# Patient Record
Sex: Male | Born: 1985 | Race: White | Hispanic: No | Marital: Single | Smoking: Current every day smoker
Health system: Southern US, Community
[De-identification: ages and names within clinical notes are randomized; demographics above are authoritative.]

## PROBLEM LIST (undated history)

## (undated) DIAGNOSIS — R569 Unspecified convulsions: Secondary | ICD-10-CM

## (undated) DIAGNOSIS — G43909 Migraine, unspecified, not intractable, without status migrainosus: Secondary | ICD-10-CM

## (undated) DIAGNOSIS — J45909 Unspecified asthma, uncomplicated: Secondary | ICD-10-CM

## (undated) HISTORY — DX: Unspecified asthma, uncomplicated: J45.909

## (undated) HISTORY — PX: FOOT SURGERY: SHX648

## (undated) HISTORY — DX: Migraine, unspecified, not intractable, without status migrainosus: G43.909

---

## 2005-10-25 ENCOUNTER — Emergency Department: Payer: Self-pay | Admitting: Emergency Medicine

## 2005-11-18 ENCOUNTER — Emergency Department: Payer: Self-pay | Admitting: Emergency Medicine

## 2006-04-13 ENCOUNTER — Emergency Department: Payer: Self-pay | Admitting: Emergency Medicine

## 2006-04-21 ENCOUNTER — Emergency Department: Payer: Self-pay | Admitting: Emergency Medicine

## 2006-06-21 ENCOUNTER — Emergency Department: Payer: Self-pay | Admitting: Emergency Medicine

## 2006-06-21 ENCOUNTER — Other Ambulatory Visit: Payer: Self-pay

## 2006-07-03 ENCOUNTER — Emergency Department: Payer: Self-pay | Admitting: General Practice

## 2007-07-25 ENCOUNTER — Emergency Department: Payer: Self-pay | Admitting: Emergency Medicine

## 2007-09-24 ENCOUNTER — Emergency Department: Payer: Self-pay | Admitting: Emergency Medicine

## 2007-10-01 ENCOUNTER — Emergency Department: Payer: Self-pay | Admitting: Emergency Medicine

## 2007-12-24 ENCOUNTER — Emergency Department: Payer: Self-pay | Admitting: Emergency Medicine

## 2008-11-28 ENCOUNTER — Emergency Department: Payer: Self-pay | Admitting: Emergency Medicine

## 2009-03-25 ENCOUNTER — Emergency Department: Payer: Self-pay | Admitting: Emergency Medicine

## 2009-04-04 ENCOUNTER — Emergency Department: Payer: Self-pay | Admitting: Emergency Medicine

## 2009-04-08 ENCOUNTER — Emergency Department: Payer: Self-pay | Admitting: Emergency Medicine

## 2010-01-30 ENCOUNTER — Emergency Department: Payer: Self-pay | Admitting: Emergency Medicine

## 2013-11-07 ENCOUNTER — Emergency Department: Payer: Self-pay | Admitting: Emergency Medicine

## 2013-11-07 LAB — COMPREHENSIVE METABOLIC PANEL
ALK PHOS: 100 U/L
ALT: 19 U/L (ref 12–78)
AST: 22 U/L (ref 15–37)
Albumin: 3.9 g/dL (ref 3.4–5.0)
Anion Gap: 7 (ref 7–16)
BUN: 7 mg/dL (ref 7–18)
Bilirubin,Total: 0.7 mg/dL (ref 0.2–1.0)
Calcium, Total: 8.7 mg/dL (ref 8.5–10.1)
Chloride: 106 mmol/L (ref 98–107)
Co2: 24 mmol/L (ref 21–32)
Creatinine: 0.98 mg/dL (ref 0.60–1.30)
EGFR (Non-African Amer.): >60
Glucose: 98 mg/dL (ref 65–99)
Osmolality: 272 (ref 275–301)
POTASSIUM: 3.9 mmol/L (ref 3.5–5.1)
Sodium: 137 mmol/L (ref 136–145)
TOTAL PROTEIN: 7.2 g/dL (ref 6.4–8.2)

## 2013-11-07 LAB — CBC
HCT: 41.6 % (ref 40.0–52.0)
HGB: 13.7 g/dL (ref 13.0–18.0)
MCH: 28.8 pg (ref 26.0–34.0)
MCHC: 33 g/dL (ref 32.0–36.0)
MCV: 87 fL (ref 80–100)
Platelet: 261 10*3/uL (ref 150–440)
RBC: 4.76 10*6/uL (ref 4.40–5.90)
RDW: 13.9 % (ref 11.5–14.5)
WBC: 10.2 10*3/uL (ref 3.8–10.6)

## 2013-11-07 LAB — VALPROIC ACID LEVEL: Valproic Acid: <3 ug/mL — ABNORMAL LOW

## 2014-08-31 ENCOUNTER — Ambulatory Visit (INDEPENDENT_AMBULATORY_CARE_PROVIDER_SITE_OTHER): Payer: Managed Care, Other (non HMO) | Admitting: Family Medicine

## 2014-08-31 VITALS — BP 116/76 | HR 85 | Temp 98.8°F | Resp 18 | Ht 73.5 in | Wt 134.0 lb

## 2014-08-31 DIAGNOSIS — R112 Nausea with vomiting, unspecified: Secondary | ICD-10-CM | POA: Diagnosis not present

## 2014-08-31 DIAGNOSIS — R591 Generalized enlarged lymph nodes: Secondary | ICD-10-CM

## 2014-08-31 DIAGNOSIS — D72829 Elevated white blood cell count, unspecified: Secondary | ICD-10-CM

## 2014-08-31 DIAGNOSIS — R197 Diarrhea, unspecified: Secondary | ICD-10-CM

## 2014-08-31 DIAGNOSIS — R599 Enlarged lymph nodes, unspecified: Secondary | ICD-10-CM | POA: Diagnosis not present

## 2014-08-31 DIAGNOSIS — J029 Acute pharyngitis, unspecified: Secondary | ICD-10-CM | POA: Diagnosis not present

## 2014-08-31 LAB — POCT CBC
GRANULOCYTE PERCENT: 73.5 % (ref 37–80)
HCT, POC: 41.1 % — AB (ref 43.5–53.7)
HEMOGLOBIN: 13.7 g/dL — AB (ref 14.1–18.1)
Lymph, poc: 2.3 (ref 0.6–3.4)
MCH, POC: 28.7 pg (ref 27–31.2)
MCHC: 33.3 g/dL (ref 31.8–35.4)
MCV: 86.1 fL (ref 80–97)
MID (cbc): 1.1 — AB (ref 0–0.9)
MPV: 7.9 fL (ref 0–99.8)
POC GRANULOCYTE: 9.3 — AB (ref 2–6.9)
POC LYMPH PERCENT: 18.1 %L (ref 10–50)
POC MID %: 8.4 % (ref 0–12)
Platelet Count, POC: 258 10*3/uL (ref 142–424)
RBC: 4.77 M/uL (ref 4.69–6.13)
RDW, POC: 13.7 %
WBC: 12.6 10*3/uL — AB (ref 4.6–10.2)

## 2014-08-31 LAB — POCT RAPID STREP A (OFFICE): RAPID STREP A SCREEN: NEGATIVE

## 2014-08-31 MED ORDER — AZITHROMYCIN 250 MG PO TABS: ORAL_TABLET | ORAL | Status: DC

## 2014-08-31 MED ORDER — MAGIC MOUTHWASH W/LIDOCAINE: 5.0000 mL | Freq: Four times a day (QID) | ORAL | Status: DC | PRN

## 2014-08-31 NOTE — Patient Instructions (Addendum)
Start azithromycin for possible strep throat (the throat culture should be back in next 3-4 days and is more accurate than test in office). Tylenol or advil if needed, magic mouthwash, bland foods, drink plenty of liquids and immodium over the counter if needed for diarrhea.  If you are not improving in next 3-4 days - return for recheck.  Avoid contact sports for next 6 weeks as "mono" also possible.  Return to the clinic or go to the nearest emergency room if any of your symptoms worsen or new symptoms occur.  Sore Throat A sore throat is pain, burning, irritation, or scratchiness of the throat. There is often pain or tenderness when swallowing or talking. A sore throat may be accompanied by other symptoms, such as coughing, sneezing, fever, and swollen neck glands. A sore throat is often the first sign of another sickness, such as a cold, flu, strep throat, or mononucleosis (commonly known as mono). Most sore throats go away without medical treatment. CAUSES  The most common causes of a sore throat include:  A viral infection, such as a cold, flu, or mono.  A bacterial infection, such as strep throat, tonsillitis, or whooping cough.  Seasonal allergies.  Dryness in the air.  Irritants, such as smoke or pollution.  Gastroesophageal reflux disease (GERD). HOME CARE INSTRUCTIONS   Only take over-the-counter medicines as directed by your caregiver.  Drink enough fluids to keep your urine clear or pale yellow.  Rest as needed.  Try using throat sprays, lozenges, or sucking on hard candy to ease any pain (if older than 4 years or as directed).  Sip warm liquids, such as broth, herbal tea, or warm water with honey to relieve pain temporarily. You may also eat or drink cold or frozen liquids such as frozen ice pops.  Gargle with salt water (mix 1 tsp salt with 8 oz of water).  Do not smoke and avoid secondhand smoke.  Put a cool-mist humidifier in your bedroom at night to moisten the  air. You can also turn on a hot shower and sit in the bathroom with the door closed for 5-10 minutes. SEEK IMMEDIATE MEDICAL CARE IF:  You have difficulty breathing.  You are unable to swallow fluids, soft foods, or your saliva.  You have increased swelling in the throat.  Your sore throat does not get better in 7 days.  You have nausea and vomiting.  You have a fever or persistent symptoms for more than 2-3 days.  You have a fever and your symptoms suddenly get worse. MAKE SURE YOU:   Understand these instructions.  Will watch your condition.  Will get help right away if you are not doing well or get worse. Document Released: 05/15/2004 Document Revised: 03/24/2012 Document Reviewed: 12/14/2011 [facility] Patient Information 2015 [place], [place]. This information is not intended to replace advice given to you by your health care provider. Make sure you discuss any questions you have with your health care provider.   Strep Throat Strep throat is an infection of the throat caused by a bacteria named Streptococcus pyogenes. Your health care provider may call the infection streptococcal "tonsillitis" or "pharyngitis" depending on whether there are signs of inflammation in the tonsils or back of the throat. Strep throat is most common in children aged 5-15 years during the cold months of the year, but it can occur in people of any age during any season. This infection is spread from person to person (contagious) through coughing, sneezing, or other close  contact. SIGNS AND SYMPTOMS   Fever or chills.  Painful, swollen, red tonsils or throat.  Pain or difficulty when swallowing.  White or yellow spots on the tonsils or throat.  Swollen, tender lymph nodes or "glands" of the neck or under the jaw.  Red rash all over the body (rare). DIAGNOSIS  Many different infections can cause the same symptoms. A test must be done to confirm the diagnosis so the right treatment can be given. A  "rapid strep test" can help your health care provider make the diagnosis in a few minutes. If this test is not available, a light swab of the infected area can be used for a throat culture test. If a throat culture test is done, results are usually available in a day or two. TREATMENT  Strep throat is treated with antibiotic medicine. HOME CARE INSTRUCTIONS   Gargle with 1 tsp of salt in 1 cup of warm water, 3-4 times per day or as needed for comfort.  Family members who also have a sore throat or fever should be tested for strep throat and treated with antibiotics if they have the strep infection.  Make sure everyone in your household washes their hands well.  Do not share food, drinking cups, or personal items that could cause the infection to spread to others.  You may need to eat a soft food diet until your sore throat gets better.  Drink enough water and fluids to keep your urine clear or pale yellow. This will help prevent dehydration.  Get plenty of rest.  Stay home from school, day care, or work until you have been on antibiotics for 24 hours.  Take medicines only as directed by your health care provider.  Take your antibiotic medicine as directed by your health care provider. Finish it even if you start to feel better. SEEK MEDICAL CARE IF:   The glands in your neck continue to enlarge.  You develop a rash, cough, or earache.  You cough up green, yellow-brown, or bloody sputum.  You have pain or discomfort not controlled by medicines.  Your problems seem to be getting worse rather than better.  You have a fever. SEEK IMMEDIATE MEDICAL CARE IF:   You develop any new symptoms such as vomiting, severe headache, stiff or painful neck, chest pain, shortness of breath, or trouble swallowing.  You develop severe throat pain, drooling, or changes in your voice.  You develop swelling of the neck, or the skin on the neck becomes red and tender.  You develop signs of  dehydration, such as fatigue, dry mouth, and decreased urination.  You become increasingly sleepy, or you cannot wake up completely. MAKE SURE YOU:  Understand these instructions.  Will watch your condition.  Will get help right away if you are not doing well or get worse. Document Released: 04/04/2000 Document Revised: 08/22/2013 Document Reviewed: 06/06/2010 [facility] Patient Information 2015 [place], [place]. This information is not intended to replace advice given to you by your health care provider. Make sure you discuss any questions you have with your health care provider.  Diarrhea Diarrhea is frequent loose and watery bowel movements. It can cause you to feel weak and dehydrated. Dehydration can cause you to become tired and thirsty, have a dry mouth, and have decreased urination that often is dark yellow. Diarrhea is a sign of another problem, most often an infection that will not last long. In most cases, diarrhea typically lasts 2-3 days. However, it can last longer if  it is a sign of something more serious. It is important to treat your diarrhea as directed by your caregiver to lessen or prevent future episodes of diarrhea. CAUSES  Some common causes include:  Gastrointestinal infections caused by viruses, bacteria, or parasites.  Food poisoning or food allergies.  Certain medicines, such as antibiotics, chemotherapy, and laxatives.  Artificial sweeteners and fructose.  Digestive disorders. HOME CARE INSTRUCTIONS  Ensure adequate fluid intake (hydration): Have 1 cup (8 oz) of fluid for each diarrhea episode. Avoid fluids that contain simple sugars or sports drinks, fruit juices, whole milk products, and sodas. Your urine should be clear or pale yellow if you are drinking enough fluids. Hydrate with an oral rehydration solution that you can purchase at pharmacies, retail stores, and online. You can prepare an oral rehydration solution at home by mixing the following ingredients  together:   - tsp table salt.   tsp baking soda.   tsp salt substitute containing potassium chloride.  1  tablespoons sugar.  1 L (34 oz) of water.  Certain foods and beverages may increase the speed at which food moves through the gastrointestinal (GI) tract. These foods and beverages should be avoided and include:  Caffeinated and alcoholic beverages.  High-fiber foods, such as raw fruits and vegetables, nuts, seeds, and whole grain breads and cereals.  Foods and beverages sweetened with sugar alcohols, such as xylitol, sorbitol, and mannitol.  Some foods may be well tolerated and may help thicken stool including:  Starchy foods, such as rice, toast, pasta, low-sugar cereal, oatmeal, grits, baked potatoes, crackers, and bagels.  Bananas.  Applesauce.  Add probiotic-rich foods to help increase healthy bacteria in the GI tract, such as yogurt and fermented milk products.  Wash your hands well after each diarrhea episode.  Only take over-the-counter or prescription medicines as directed by your caregiver.  Take a warm bath to relieve any burning or pain from frequent diarrhea episodes. SEEK IMMEDIATE MEDICAL CARE IF:   You are unable to keep fluids down.  You have persistent vomiting.  You have blood in your stool, or your stools are black and tarry.  You do not urinate in 6-8 hours, or there is only a small amount of very dark urine.  You have abdominal pain that increases or localizes.  You have weakness, dizziness, confusion, or light-headedness.  You have a severe headache.  Your diarrhea gets worse or does not get better.  You have a fever or persistent symptoms for more than 2-3 days.  You have a fever and your symptoms suddenly get worse. MAKE SURE YOU:   Understand these instructions.  Will watch your condition.  Will get help right away if you are not doing well or get worse. Document Released: 03/28/2002 Document Revised: 08/22/2013 Document  Reviewed: 12/14/2011 [facility]Exit[facility] Patient Information 2015 [place] SanchezExitCare, [place]. This information is not intended to replace advice given to you by your health care provider. Make sure you discuss any questions you have with your health care provider.

## 2014-08-31 NOTE — Progress Notes (Addendum)
Subjective:  This chart was scribed for Peter Maselli, MD by Peter [name], medical scribe at [name].The patient was seen in exam room 10 and the patient's [name] was started at 11:34 AM.   Patient ID: Peter Dunningerek R Mingle, male    DOB: 06/15/1985, 29 y.o.   MRN: [id number] Chief Complaint  Patient presents with  . Emesis    x 2 days  . Sore Throat    x 2 days   HPI HPI Comments: Peter DunningDerek R Horsfall is a 29 y.o. male who presents to [name] complaining of emesis, sore throat and diarrhea. Pt had two episodes of emesis two days ago none since then. Pt says he has had two episodes of diarrhea which began last night. PO intake normal. Normal urination.  He has had sick contacts, his girlfriend was throwing up yesterday. He has not taken any medications. 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 He denies fever.  There are no active problems to display for this patient.  Past Medical History  Diagnosis Date  . Asthma    History reviewed. No pertinent past surgical history. Allergies  Allergen Reactions  . Amoxicillin Rash and Other (See Comments)    Swelling of the throat   Prior to Admission medications   Medication Sig Start Date End Date Taking? Authorizing Provider  divalproex (DEPAKOTE) 500 MG DR tablet Take 500 mg by mouth 2 (two) times daily.   Yes Historical Provider, MD   History   Social History  . Marital Status: Single    Spouse Name: N/A  . Number of Children: N/A  . Years of Education: N/A   Occupational History  . Not on file.   Social History Main Topics  . Smoking status: Current Every Day Smoker  . Smokeless tobacco: Not on file  . Alcohol Use: No  . Drug Use: No  . Sexual Activity: Not on file   Other Topics Concern  . Not on file   Social History Narrative  . No narrative on file   Review of Systems  HENT: Positive for sore throat.   Gastrointestinal: Positive for vomiting and diarrhea.      Objective:  BP 116/76  mmHg  Pulse 85  Temp(Src) 98.8 F (37.1 C) (Oral)  Resp 18  Ht 6' 1.5" (1.867 m)  Wt 134 lb (60.782 kg)  BMI 17.44 kg/m2  SpO2 98% Physical Exam  Constitutional: He is oriented to person, place, and time. He appears well-developed and well-nourished. No distress.  HENT:  Head: Normocephalic and atraumatic.  Exudate right greater than left tonsil with erythema. No PTA.  Eyes: Pupils are equal, round, and reactive to light.  Neck: Normal range of motion.  Enlarged right greater than left AC nodes tender to palpation.  Cardiovascular: Normal rate and regular rhythm.   Pulmonary/Chest: Effort normal. No respiratory distress.  Abdominal: Soft. He exhibits no distension.  Hyperactive bowel sounds.  Musculoskeletal: Normal range of motion.  Lymphadenopathy:       Right cervical: No superficial cervical adenopathy present.      Left cervical: No superficial cervical adenopathy present.       Right: No supraclavicular and no epitrochlear adenopathy present.       Left: No supraclavicular and no epitrochlear adenopathy present.  Neurological: He is alert and oriented to person, place, and time.  Skin: Skin is warm and dry.  Psychiatric: He has a normal mood and affect. His behavior is normal.  Nursing  note and vitals reviewed.  Results for orders placed or performed in visit on 08/31/14  POCT rapid strep A  Result Value Ref Range   Rapid Strep A Screen Negative Negative  POCT CBC  Result Value Ref Range   WBC 12.6 (A) 4.6 - 10.2 K/uL   Lymph, poc 2.3 0.6 - 3.4   POC LYMPH PERCENT 18.1 10 - 50 %L   MID (cbc) 1.1 (A) 0 - 0.9   POC MID % 8.4 0 - 12 %M   POC Granulocyte 9.3 (A) 2 - 6.9   Granulocyte percent 73.5 37 - 80 %G   RBC 4.77 4.69 - 6.13 M/uL   Hemoglobin 13.7 (A) 14.1 - 18.1 g/dL   HCT, POC 16.1 (A) 09.6 - 53.7 %   MCV 86.1 80 - 97 fL   MCH, POC 28.7 27 - 31.2 pg   MCHC 33.3 31.8 - 35.4 g/dL   RDW, POC 04.5 %   Platelet Count, POC 258 142 - 424 K/uL   MPV 7.9 0 - 99.8  fL       Assessment & Plan:   Peter [name] is a 29 y.o. male Sore throat - Plan: POCT rapid strep A, Culture, Group A Strep, POCT CBC, azithromycin (ZITHROMAX) 250 MG tablet, Alum & Mag Hydroxide-Simeth (MAGIC MOUTHWASH W/LIDOCAINE) SOLN  Lymphadenopathy of head and neck - Plan: POCT rapid strep A, POCT CBC  Non-intractable vomiting with nausea, vomiting of unspecified type  Diarrhea  Leukocytosis - Plan: azithromycin (ZITHROMAX) 250 MG tablet  Possible false negative strep with exudative tonsilitis, and leukocytosis.  DDX also includes viral illness such as mono with recent diarrhea and prior vomiting.   -start Zpak (amox allergic) to cover for strep. Throat cx pending.   -sx [name] with antipyretics, MMW.   -avoid contact sports/spleen precautions discussed in case of infectious mononucleosis.   -OOW tonight  -sx [name] for diarrhea, RTC precautions discussed.   Meds ordered this encounter  Medications  . divalproex (DEPAKOTE) 500 MG DR tablet    Sig: Take 500 mg by mouth 2 (two) times daily.  Marland Kitchen azithromycin (ZITHROMAX) 250 MG tablet    Sig: Take 2 pills by mouth on day 1, then 1 pill by mouth per day on days 2 through 5.    Dispense:  6 tablet    Refill:  0  . Alum & Mag Hydroxide-Simeth (MAGIC MOUTHWASH W/LIDOCAINE) SOLN    Sig: Take 5 mLs by mouth 4 (four) times daily as needed for mouth pain.    Dispense:  120 mL    Refill:  0    Ok to substitute ingredients per pharmacy usual "magic mouthwash" prep.   Patient Instructions  Start azithromycin for possible strep throat (the throat culture should be back in next 3-4 days and is more accurate than test in office). Tylenol or advil if needed, magic mouthwash, bland foods, drink plenty of liquids and immodium over the counter if needed for diarrhea.  If you are not improving in next 3-4 days - return for recheck.  Avoid contact sports for next 6 weeks as "mono" also possible.  Return to the clinic or go to the nearest  emergency room if any of your symptoms worsen or new symptoms occur.  Sore Throat A sore throat is pain, burning, irritation, or scratchiness of the throat. There is often pain or tenderness when swallowing or talking. A sore throat may be accompanied by other symptoms, such as coughing, sneezing, fever, and swollen neck  glands. A sore throat is often the first sign of another sickness, such as a cold, flu, strep throat, or mononucleosis (commonly known as mono). Most sore throats go away without medical treatment. CAUSES  The most common causes of a sore throat include:  A viral infection, such as a cold, flu, or mono.  A bacterial infection, such as strep throat, tonsillitis, or whooping cough.  Seasonal allergies.  Dryness in the air.  Irritants, such as smoke or pollution.  Gastroesophageal reflux disease (GERD). HOME [name] INSTRUCTIONS   Only take over-the-counter medicines as directed by your caregiver.  Drink enough fluids to keep your urine clear or pale yellow.  Rest as needed.  Try using throat sprays, lozenges, or sucking on hard candy to ease any pain (if older than 4 years or as directed).  Sip warm liquids, such as broth, herbal tea, or warm water with honey to relieve pain temporarily. You may also eat or drink cold or frozen liquids such as frozen ice pops.  Gargle with salt water (mix 1 tsp salt with 8 oz of water).  Do not smoke and avoid secondhand smoke.  Put a cool-mist humidifier in your bedroom at night to moisten the air. You can also turn on a hot shower and sit in the bathroom with the door closed for 5-10 minutes. SEEK IMMEDIATE MEDICAL [name] IF:  You have difficulty breathing.  You are unable to swallow fluids, soft foods, or your saliva.  You have increased swelling in the throat.  Your sore throat does not get better in 7 days.  You have nausea and vomiting.  You have a fever or persistent symptoms for more than 2-3 days.  You have a fever  and your symptoms suddenly get worse. MAKE SURE YOU:   Understand these instructions.  Will watch your condition.  Will get help right away if you are not doing well or get worse. Document Released: 05/15/2004 Document Revised: 03/24/2012 Document Reviewed: 12/14/2011 [facility] Patient Information 2015 [place]. This information is not intended to replace advice given to you by your health [name] provider. Make sure you discuss any questions you have with your health [name] provider.   Strep Throat Strep throat is an infection of the throat caused by a bacteria named Streptococcus pyogenes. Your health [name] provider may call the infection streptococcal "tonsillitis" or "pharyngitis" depending on whether there are signs of inflammation in the tonsils or back of the throat. Strep throat is most common in children aged 5-15 years during the cold months of the year, but it can occur in people of any age during any season. This infection is spread from person to person (contagious) through coughing, sneezing, or other close contact. SIGNS AND SYMPTOMS   Fever or chills.  Painful, swollen, red tonsils or throat.  Pain or difficulty when swallowing.  White or yellow spots on the tonsils or throat.  Swollen, tender lymph nodes or "glands" of the neck or under the jaw.  Red rash all over the body (rare). DIAGNOSIS  Many different infections can cause the same symptoms. A test must be done to confirm the diagnosis so the right treatment can be given. A "rapid strep test" can help your health [name] provider make the diagnosis in a few minutes. If this test is not available, a light swab of the infected area can be used for a throat culture test. If a throat culture test is done, results are usually available in a day or two. TREATMENT  Strep throat is treated with antibiotic medicine. HOME [name] INSTRUCTIONS   Gargle with 1 tsp of salt in 1 cup of warm water, 3-4 times per day or as needed for  comfort.  Family members who also have a sore throat or fever should be tested for strep throat and treated with antibiotics if they have the strep infection.  Make sure everyone in your household washes their hands well.  Do not share food, drinking cups, or personal items that could cause the infection to spread to others.  You may need to eat a soft food diet until your sore throat gets better.  Drink enough water and fluids to keep your urine clear or pale yellow. This will help prevent dehydration.  Get plenty of rest.  Stay home from school, day [name], or work until you have been on antibiotics for 24 hours.  Take medicines only as directed by your health [name] provider.  Take your antibiotic medicine as directed by your health [name] provider. Finish it even if you start to feel better. SEEK MEDICAL [name] IF:   The glands in your neck continue to enlarge.  You develop a rash, cough, or earache.  You cough up green, yellow-brown, or bloody sputum.  You have pain or discomfort not controlled by medicines.  Your problems seem to be getting worse rather than better.  You have a fever. SEEK IMMEDIATE MEDICAL [name] IF:   You develop any new symptoms such as vomiting, severe headache, stiff or painful neck, chest pain, shortness of breath, or trouble swallowing.  You develop severe throat pain, drooling, or changes in your voice.  You develop swelling of the neck, or the skin on the neck becomes red and tender.  You develop signs of dehydration, such as fatigue, dry mouth, and decreased urination.  You become increasingly sleepy, or you cannot wake up completely. MAKE SURE YOU:  Understand these instructions.  Will watch your condition.  Will get help right away if you are not doing well or get worse. Document Released: 04/04/2000 Document Revised: 08/22/2013 Document Reviewed: 06/06/2010 [facility] Patient Information 2015 [place], [place]. This information is not intended  to replace advice given to you by your health [name] provider. Make sure you discuss any questions you have with your health [name] provider.  Diarrhea Diarrhea is frequent loose and watery bowel movements. It can cause you to feel weak and dehydrated. Dehydration can cause you to become tired and thirsty, have a dry mouth, and have decreased urination that often is dark yellow. Diarrhea is a sign of another problem, most often an infection that will not last long. In most cases, diarrhea typically lasts 2-3 days. However, it can last longer if it is a sign of something more serious. It is important to treat your diarrhea as directed by your caregiver to lessen or prevent future episodes of diarrhea. CAUSES  Some common causes include:  Gastrointestinal infections caused by viruses, bacteria, or parasites.  Food poisoning or food allergies.  Certain medicines, such as antibiotics, chemotherapy, and laxatives.  Artificial sweeteners and fructose.  Digestive disorders. HOME [name] INSTRUCTIONS  Ensure adequate fluid intake (hydration): Have 1 cup (8 oz) of fluid for each diarrhea episode. Avoid fluids that contain simple sugars or sports drinks, fruit juices, whole milk products, and sodas. Your urine should be clear or pale yellow if you are drinking enough fluids. Hydrate with an oral rehydration solution that you can purchase at pharmacies, retail stores, and online. You can prepare an  oral rehydration solution at home by mixing the following ingredients together:   - tsp table salt.   tsp baking soda.   tsp salt substitute containing potassium chloride.  1  tablespoons sugar.  1 L (34 oz) of water.  Certain foods and beverages may increase the speed at which food moves through the gastrointestinal (GI) tract. These foods and beverages should be avoided and include:  Caffeinated and alcoholic beverages.  High-fiber foods, such as raw fruits and vegetables, nuts, seeds, and whole grain  breads and cereals.  Foods and beverages sweetened with sugar alcohols, such as xylitol, sorbitol, and mannitol.  Some foods may be well tolerated and may help thicken stool including:  Starchy foods, such as rice, toast, pasta, low-sugar cereal, oatmeal, grits, baked potatoes, crackers, and bagels.  Bananas.  Applesauce.  Add probiotic-rich foods to help increase healthy bacteria in the GI tract, such as yogurt and fermented milk products.  Wash your hands well after each diarrhea episode.  Only take over-the-counter or prescription medicines as directed by your caregiver.  Take a warm bath to relieve any burning or pain from frequent diarrhea episodes. SEEK IMMEDIATE MEDICAL [name] IF:   You are unable to keep fluids down.  You have persistent vomiting.  You have blood in your stool, or your stools are black and tarry.  You do not urinate in 6-8 hours, or there is only a small amount of very dark urine.  You have abdominal pain that increases or localizes.  You have weakness, dizziness, confusion, or light-headedness.  You have a severe headache.  Your diarrhea gets worse or does not get better.  You have a fever or persistent symptoms for more than 2-3 days.  You have a fever and your symptoms suddenly get worse. MAKE SURE YOU:   Understand these instructions.  Will watch your condition.  Will get help right away if you are not doing well or get worse. Document Released: 03/28/2002 Document Revised: 08/22/2013 Document Reviewed: 12/14/2011 [facility] Patient Information 2015 [place], [place]. This information is not intended to replace advice given to you by your health [name] provider. Make sure you discuss any questions you have with your health [name] provider.        I personally performed the services described in this documentation, which was scribed in my presence. The recorded information has been reviewed and considered, and addended by me as needed.

## 2014-09-02 LAB — CULTURE, GROUP A STREP

## 2015-03-07 ENCOUNTER — Emergency Department: Payer: Worker's Compensation

## 2015-03-07 ENCOUNTER — Emergency Department
Admission: EM | Admit: 2015-03-07 | Discharge: 2015-03-07 | Disposition: A | Discharge status: Home / Self Care | Payer: Worker's Compensation | Attending: Emergency Medicine | Admitting: Emergency Medicine

## 2015-03-07 ENCOUNTER — Encounter: Payer: Self-pay | Admitting: Emergency Medicine

## 2015-03-07 DIAGNOSIS — X58XXXA Exposure to other specified factors, initial encounter: Secondary | ICD-10-CM | POA: Insufficient documentation

## 2015-03-07 DIAGNOSIS — F17200 Nicotine dependence, unspecified, uncomplicated: Secondary | ICD-10-CM | POA: Insufficient documentation

## 2015-03-07 DIAGNOSIS — Y990 Civilian activity done for income or pay: Secondary | ICD-10-CM | POA: Insufficient documentation

## 2015-03-07 DIAGNOSIS — Z79899 Other long term (current) drug therapy: Secondary | ICD-10-CM | POA: Diagnosis not present

## 2015-03-07 DIAGNOSIS — M25561 Pain in right knee: Secondary | ICD-10-CM

## 2015-03-07 DIAGNOSIS — Y9289 Other specified places as the place of occurrence of the external cause: Secondary | ICD-10-CM | POA: Diagnosis not present

## 2015-03-07 DIAGNOSIS — S86811A Strain of other muscle(s) and tendon(s) at lower leg level, right leg, initial encounter: Secondary | ICD-10-CM | POA: Diagnosis not present

## 2015-03-07 DIAGNOSIS — Y9389 Activity, other specified: Secondary | ICD-10-CM | POA: Insufficient documentation

## 2015-03-07 DIAGNOSIS — Z880 Allergy status to penicillin: Secondary | ICD-10-CM | POA: Insufficient documentation

## 2015-03-07 DIAGNOSIS — Z792 Long term (current) use of antibiotics: Secondary | ICD-10-CM | POA: Insufficient documentation

## 2015-03-07 DIAGNOSIS — S8991XA Unspecified injury of right lower leg, initial encounter: Secondary | ICD-10-CM | POA: Diagnosis present

## 2015-03-07 DIAGNOSIS — S86911A Strain of unspecified muscle(s) and tendon(s) at lower leg level, right leg, initial encounter: Secondary | ICD-10-CM

## 2015-03-07 MED ORDER — IBUPROFEN 800 MG PO TABS
800.0000 mg | ORAL_TABLET | Freq: Once | ORAL | Status: AC
  Administered 2015-03-07: 800 mg via ORAL
  Filled 2015-03-07: qty 1

## 2015-03-07 MED ORDER — TRAMADOL HCL 50 MG PO TABS
50.0000 mg | ORAL_TABLET | Freq: Once | ORAL | Status: AC
  Administered 2015-03-07: 50 mg via ORAL
  Filled 2015-03-07: qty 1

## 2015-03-07 MED ORDER — TRAMADOL HCL 50 MG PO TABS: 50.0000 mg | ORAL_TABLET | Freq: Four times a day (QID) | ORAL | Status: DC | PRN

## 2015-03-07 NOTE — ED Notes (Signed)
Patient ambulatory to triage with steady gait, without difficulty or distress noted; pt reports right knee pain since last night; while at work Peter [name]([employer]) bent down to pick up a part and felt pop in knee; EDT [name] in triage to complete workers comp profile ([employer] required)

## 2015-03-07 NOTE — ED Provider Notes (Signed)
[facility] Provider Note  ____________________________________________  Time seen: Approximately 453 AM  I have reviewed the triage vital signs and the nursing notes.   HISTORY  Chief Complaint Knee Pain    HPI Peter [name] is a 29 y.o. male who reports that he was at work 24 hours ago when he squatted down and felt something pop in his right knee. He reports he started experiencing pain in his right medial knee. The patient reports it is been bothering him ever since. If he tries to sit or squatted he feels as though something is pulling on that side. He does not have any swelling in his knee but his pain is 8 out of 10 in intensity. The patient is not taking anything for pain and has never had problems with this knee in the past. He reports it hurts to put pressure on his right knee surgery decided to come in for evaluation.  { Past Medical History  Diagnosis Date  . Asthma     There are no active problems to display for this patient.   History reviewed. No pertinent past surgical history.  Current Outpatient Rx  Name  Route  Sig  Dispense  Refill  . Alum & Mag Hydroxide-Simeth (MAGIC MOUTHWASH W/LIDOCAINE) SOLN   Oral   Take 5 mLs by mouth 4 (four) times daily as needed for mouth pain.   120 mL   0     Ok to substitute ingredients per pharmacy usual "m ...   . azithromycin (ZITHROMAX) 250 MG tablet      Take 2 pills by mouth on day 1, then 1 pill by mouth per day on days 2 through 5.   6 tablet   0   . divalproex (DEPAKOTE) 500 MG DR tablet   Oral   Take 500 mg by mouth 2 (two) times daily.         . traMADol (ULTRAM) 50 MG tablet   Oral   Take 1 tablet (50 mg total) by mouth every 6 (six) hours as needed.   12 tablet   0     Allergies Amoxicillin  Family History  Problem Relation Age of Onset  . Cancer Maternal Grandmother   . Mental illness Maternal Grandfather   . Mental illness Paternal  Grandmother   . Mental illness Paternal Grandfather     Social History Social History  Substance Use Topics  . Smoking status: Current Every Day Smoker  . Smokeless tobacco: None  . Alcohol Use: No    Review of Systems Constitutional: No fever/chills Eyes: No visual changes. ENT: No sore throat. Cardiovascular: Denies chest pain. Respiratory: Denies shortness of breath. Gastrointestinal: No abdominal pain.  No nausea, no vomiting.  No diarrhea.  No constipation. Genitourinary: Negative for dysuria. Musculoskeletal: Right knee pain Skin: Negative for rash. Neurological: Negative for headaches, focal weakness or numbness.  10-point ROS otherwise negative.  ____________________________________________   PHYSICAL EXAM:  VITAL SIGNS: ED Triage Vitals  Enc Vitals Group     BP 03/07/15 0412 122/70 mmHg     Pulse Rate 03/07/15 0412 73     Resp 03/07/15 0412 18     Temp 03/07/15 0412 98.4 F (36.9 C)     Temp Source 03/07/15 0412 Oral     SpO2 03/07/15 0412 97 %     Weight 03/07/15 0412 140 lb (63.504 kg)     Height 03/07/15 0412  (1.88 m)     Head  Cir --      Peak Flow --      Pain Score 03/07/15 0418 8     Pain Loc --      Pain Edu? --      Excl. in GC? --     Constitutional: Alert and oriented. Well appearing and in mild distress. Eyes: Conjunctivae are normal. PERRL. EOMI. Head: Atraumatic. Nose: No congestion/rhinnorhea. Mouth/Throat: Mucous membranes are moist.  Oropharynx non-erythematous. Cardiovascular: Normal rate, regular rhythm. Grossly normal heart sounds.  Good peripheral circulation. Respiratory: Normal respiratory effort.  No retractions. Lungs CTAB. Gastrointestinal: Soft and nontender. No distention. Positive bowel sounds Musculoskeletal: Tenderness to palpation of right knee medially with no pain to valgus or varus stressing pain with flexion and extension. No joint effusion Neurologic:  Normal speech and language.  Skin:  Skin is warm, dry  and intact. Marland Kitchen. Psychiatric: Mood and affect are normal.   ____________________________________________   LABS (all labs ordered are listed, but only abnormal results are displayed)  Labs Reviewed - No data to display ____________________________________________  EKG  None ____________________________________________  RADIOLOGY  Right knee x-ray: No fracture or dislocation of the right knee. ____________________________________________   PROCEDURES  Procedure(s) performed: None  Critical Care performed: No  ____________________________________________   INITIAL IMPRESSION / ASSESSMENT AND PLAN / ED COURSE  Pertinent labs & imaging results that were available during my care of the patient were reviewed by me and considered in my medical decision making (see chart for details).  This is a 29 year old male who reports that he had something pop in his knee one day ago. I gave the patient a dose of tramadol as well as some ibuprofen. The patient has no acute fracture but may have some ligamentous or internal deformity or derangement of his knee. I will give him some crutches and a knee immobilizer and have him follow up with orthopedic surgery for further evaluation of his knee. The patient has no further complaints or concerns at this time. ____________________________________________   FINAL CLINICAL IMPRESSION(S) / ED DIAGNOSES  Final diagnoses:  Right knee pain  Knee strain, right, initial encounter      Rebecka ApleyAllison P Webster, MD 03/07/15 [phone]

## 2015-03-07 NOTE — Discharge Instructions (Signed)
Knee Pain Knee pain is a very common symptom and can have many causes. Knee pain often goes away when you follow your health care provider's instructions for relieving pain and discomfort at home. However, knee pain can develop into a condition that needs treatment. Some conditions may include:  Arthritis caused by wear and tear (osteoarthritis).  Arthritis caused by swelling and irritation (rheumatoid arthritis or gout).  A cyst or growth in your knee.  An infection in your knee joint.  An injury that will not heal.  Damage, swelling, or irritation of the tissues that support your knee (torn ligaments or tendinitis). If your knee pain continues, additional tests may be ordered to diagnose your condition. Tests may include X-rays or other imaging studies of your knee. You may also need to have fluid removed from your knee. Treatment for ongoing knee pain depends on the cause, but treatment may include:  Medicines to relieve pain or swelling.  Steroid injections in your knee.  Physical therapy.  Surgery. HOME CARE INSTRUCTIONS  Take medicines only as directed by your health care provider.  Rest your knee and keep it raised (elevated) while you are resting.  Do not do things that cause or worsen pain.  Avoid high-impact activities or exercises, such as running, jumping rope, or doing jumping jacks.  Apply ice to the knee area:  Put ice in a plastic bag.  Place a towel between your skin and the bag.  Leave the ice on for 20 minutes, 2-3 times a day.  Ask your health care provider if you should wear an elastic knee support.  Keep a pillow under your knee when you sleep.  Lose weight if you are overweight. Extra weight can put pressure on your knee.  Do not use any tobacco products, including cigarettes, chewing tobacco, or electronic cigarettes. If you need help quitting, ask your health care provider. Smoking may slow the healing of any bone and joint problems that you may  have. SEEK MEDICAL CARE IF:  Your knee pain continues, changes, or gets worse.  You have a fever along with knee pain.  Your knee buckles or locks up.  Your knee becomes more swollen. SEEK IMMEDIATE MEDICAL CARE IF:   Your knee joint feels hot to the touch.  You have chest pain or trouble breathing.   This information is not intended to replace advice given to you by your health care provider. Make sure you discuss any questions you have with your health care provider.   Document Released: 02/02/2007 Document Revised: 04/28/2014 Document Reviewed: 11/21/2013 Elsevier Interactive Patient Education 2016 Elsevier Inc.  Musculoskeletal Pain Musculoskeletal pain is muscle and boney aches and pains. These pains can occur in any part of the body. Your caregiver may treat you without knowing the cause of the pain. They may treat you if blood or urine tests, X-rays, and other tests were normal.  CAUSES There is often not a definite cause or reason for these pains. These pains may be caused by a type of germ (virus). The discomfort may also come from overuse. Overuse includes working out too hard when your body is not fit. Boney aches also come from weather changes. Bone is sensitive to atmospheric pressure changes. HOME CARE INSTRUCTIONS   Ask when your test results will be ready. Make sure you get your test results.  Only take over-the-counter or prescription medicines for pain, discomfort, or fever as directed by your caregiver. If you were given medications for your condition, do  not drive, operate machinery or power tools, or sign legal documents for 24 hours. Do not drink alcohol. Do not take sleeping pills or other medications that may interfere with treatment. °· Continue all activities unless the activities cause more pain. When the pain lessens, slowly resume normal activities. Gradually increase the intensity and duration of the activities or exercise. °· During periods of severe pain,  bed rest may be helpful. Lay or sit in any position that is comfortable. °· Putting ice on the injured area. °¨ Put ice in a bag. °¨ Place a towel between your skin and the bag. °¨ Leave the ice on for 15 to 20 minutes, 3 to 4 times a day. °· Follow up with your caregiver for continued problems and no reason can be found for the pain. If the pain becomes worse or does not go away, it may be necessary to repeat tests or do additional testing. Your caregiver may need to look further for a possible cause. °SEEK IMMEDIATE MEDICAL CARE IF: °· You have pain that is getting worse and is not relieved by medications. °· You develop chest pain that is associated with shortness or breath, sweating, feeling sick to your stomach (nauseous), or throw up (vomit). °· Your pain becomes localized to the abdomen. °· You develop any new symptoms that seem different or that concern you. °MAKE SURE YOU:  °· Understand these instructions. °· Will watch your condition. °· Will get help right away if you are not doing well or get worse. °  °This information is not intended to replace advice given to you by your health care provider. Make sure you discuss any questions you have with your health care provider. °  °Document Released: 04/07/2005 Document Revised: 06/30/2011 Document Reviewed: 12/10/2012 °Elsevier Interactive Patient Education ©2016 Elsevier Inc. ° °

## 2015-12-03 ENCOUNTER — Emergency Department
Admission: EM | Admit: 2015-12-03 | Discharge: 2015-12-03 | Disposition: A | Discharge status: Home / Self Care | Payer: Managed Care, Other (non HMO) | Attending: Emergency Medicine | Admitting: Emergency Medicine

## 2015-12-03 DIAGNOSIS — J45909 Unspecified asthma, uncomplicated: Secondary | ICD-10-CM | POA: Insufficient documentation

## 2015-12-03 DIAGNOSIS — F17210 Nicotine dependence, cigarettes, uncomplicated: Secondary | ICD-10-CM | POA: Insufficient documentation

## 2015-12-03 DIAGNOSIS — G40909 Epilepsy, unspecified, not intractable, without status epilepticus: Secondary | ICD-10-CM | POA: Insufficient documentation

## 2015-12-03 DIAGNOSIS — R040 Epistaxis: Secondary | ICD-10-CM | POA: Diagnosis present

## 2015-12-03 HISTORY — DX: Unspecified convulsions: R56.9

## 2015-12-03 LAB — COMPREHENSIVE METABOLIC PANEL
ALK PHOS: 114 U/L (ref 38–126)
ALT: 12 U/L — AB (ref 17–63)
AST: 21 U/L (ref 15–41)
Albumin: 4.3 g/dL (ref 3.5–5.0)
Anion gap: 6 (ref 5–15)
BILIRUBIN TOTAL: 1 mg/dL (ref 0.3–1.2)
BUN: 5 mg/dL — AB (ref 6–20)
CALCIUM: 9.1 mg/dL (ref 8.9–10.3)
CHLORIDE: 106 mmol/L (ref 101–111)
CO2: 26 mmol/L (ref 22–32)
CREATININE: 0.93 mg/dL (ref 0.61–1.24)
Glucose, Bld: 91 mg/dL (ref 65–99)
Potassium: 4.1 mmol/L (ref 3.5–5.1)
Sodium: 138 mmol/L (ref 135–145)
TOTAL PROTEIN: 7 g/dL (ref 6.5–8.1)

## 2015-12-03 LAB — CBC
HEMATOCRIT: 42.2 % (ref 40.0–52.0)
HEMOGLOBIN: 14.7 g/dL (ref 13.0–18.0)
MCH: 29.6 pg (ref 26.0–34.0)
MCHC: 34.7 g/dL (ref 32.0–36.0)
MCV: 85.3 fL (ref 80.0–100.0)
Platelets: 272 10*3/uL (ref 150–440)
RBC: 4.95 MIL/uL (ref 4.40–5.90)
RDW: 14.1 % (ref 11.5–14.5)
WBC: 7.3 10*3/uL (ref 3.8–10.6)

## 2015-12-03 LAB — VALPROIC ACID LEVEL

## 2015-12-03 MED ORDER — ACETAMINOPHEN 325 MG PO TABS
650.0000 mg | ORAL_TABLET | Freq: Once | ORAL | Status: AC
  Administered 2015-12-03: 650 mg via ORAL
  Filled 2015-12-03: qty 2

## 2015-12-03 MED ORDER — LEVETIRACETAM 500 MG PO TABS: 500.0000 mg | ORAL_TABLET | Freq: Two times a day (BID) | ORAL | 0 refills | Status: DC

## 2015-12-03 MED ORDER — SODIUM CHLORIDE 0.9 % IV SOLN
1000.0000 mg | Freq: Once | INTRAVENOUS | Status: AC
  Administered 2015-12-03: 1000 mg via INTRAVENOUS
  Filled 2015-12-03: qty 10

## 2015-12-03 MED ORDER — DIVALPROEX SODIUM 500 MG PO DR TAB: 500.0000 mg | DELAYED_RELEASE_TABLET | Freq: Once | ORAL | Status: DC

## 2015-12-03 NOTE — ED Notes (Signed)
Pt discharged home after verbalizing understanding of discharge instructions; nad noted. 

## 2015-12-03 NOTE — ED Provider Notes (Signed)
[facility]  ____________________________________________   First MD Initiated Contact with Patient 12/03/15 [phone]     (approximate)  I have reviewed the triage vital signs and the nursing notes.   HISTORY  Chief Complaint Seizures    HPI Peter [name] is a 30 y.o. male with history of seizure disorder presents with witnessed seizure at work of unknown duration. Per EMS the patient fell striking his face on the floor during the event. Patient states that he takes Depakote for his seizures and has been doing so for many years states that he has not seen a neurologist in many years. Patient states that he has seizures frequently averaging approximately every week. Patient states that he's been compliant with his Depakote.   Past Medical History:  Diagnosis Date  . Asthma   . Seizures (HCC)     There are no active problems to display for this patient.   History reviewed. No pertinent surgical history.  Prior to Admission medications   Medication Sig Start Date End Date Taking? Authorizing Provider  Alum & Mag Hydroxide-Simeth (MAGIC MOUTHWASH W/LIDOCAINE) SOLN Take 5 mLs by mouth 4 (four) times daily as needed for mouth pain. 08/31/14   Shade FloodJeffrey R Greene, MD  azithromycin (ZITHROMAX) 250 MG tablet Take 2 pills by mouth on day 1, then 1 pill by mouth per day on days 2 through 5. 08/31/14   Shade FloodJeffrey R Greene, MD  divalproex (DEPAKOTE) 500 MG DR tablet Take 500 mg by mouth 2 (two) times daily.    Historical Provider, MD  traMADol (ULTRAM) 50 MG tablet Take 1 tablet (50 mg total) by mouth every 6 (six) hours as needed. 03/07/15   Rebecka ApleyAllison P Webster, MD    Allergies Amoxicillin  Family History  Problem Relation Age of Onset  . Cancer Maternal Grandmother   . Mental illness Maternal Grandfather   . Mental illness Paternal Grandmother   . Mental illness Paternal Grandfather     Social History Social History  Substance Use Topics  . Smoking status:  Current Every Day Smoker  . Smokeless tobacco: Not on file  . Alcohol use Not on file    Review of Systems Constitutional: No fever/chills Eyes: No visual changes. ENT: No sore throat.Positive for nosebleed Cardiovascular: Denies chest pain. Respiratory: Denies shortness of breath. Gastrointestinal: No abdominal pain.  No nausea, no vomiting.  No diarrhea.  No constipation. Genitourinary: Negative for dysuria. Musculoskeletal: Negative for back pain. Skin: Negative for rash. Neurological: Positive for seizure 10-point ROS otherwise negative.  ____________________________________________   PHYSICAL EXAM:  VITAL SIGNS: ED Triage Vitals  Enc Vitals Group     BP 12/03/15 0456 130/85     Pulse Rate 12/03/15 0456 (!) 108     Resp 12/03/15 0456 18     Temp 12/03/15 0456 98.7 F (37.1 C)     Temp Source 12/03/15 0456 Oral     SpO2 12/03/15 0456 98 %     Weight 12/03/15 0459 145 lb (65.8 kg)     Height 12/03/15 0459 6\' 3"  (1.905 m)     Head Circumference --      Peak Flow --      Pain Score 12/03/15 0500 10     Pain Loc --      Pain Edu? --      Excl. in GC? --     Constitutional: Alert and oriented. Well appearing and in no acute distress. Eyes: Conjunctivae are normal. PERRL. EOMI. Head: Atraumatic. Ears:  Healthy appearing ear canals and TMs bilaterally Nose: No congestion/rhinnorhea.Dried blood noted bilateral naris Mouth/Throat: Mucous membranes are moist.  Oropharynx non-erythematous. Neck: No stridor.  No meningeal signs.  Cardiovascular: Normal rate, regular rhythm. Good peripheral circulation. Grossly normal heart sounds.   Respiratory: Normal respiratory effort.  No retractions. Lungs CTAB. Gastrointestinal: Soft and nontender. No distention.  Genitourinary:  Musculoskeletal: No lower extremity tenderness nor edema. No gross deformities of extremities. Neurologic:  Normal speech and language. No gross focal neurologic deficits are appreciated.  Skin:  Skin is  warm, dry and intact. No rash noted. Psychiatric: Mood and affect are normal. Speech and behavior are normal.  ____________________________________________   LABS (all labs ordered are listed, but only abnormal results are displayed)  Labs Reviewed  COMPREHENSIVE METABOLIC PANEL - Abnormal; Notable for the following:       Result Value   BUN 5 (*)    ALT 12 (*)    All other components within normal limits  CBC  VALPROIC ACID LEVEL    RADIOLOGY I, Karnak N BROWN, personally viewed and evaluated these images (plain radiographs) as part of my medical decision making, as well as reviewing the written report by the radiologist.  No results found.   Procedures    INITIAL IMPRESSION / ASSESSMENT AND PLAN / ED COURSE  Pertinent labs & imaging results that were available during my care of the patient were reviewed by me and considered in my medical decision making (see chart for details).  Patient with witnessed generalized tonic-clonic seizure, states that he's compliant with his Depakote however Depakote level is undetectable.I informed the patient that his Depakote level was undetectable and he admitted that he had not been taking it secondary to the fact that he doesn't have it and does not like the side effects of the medication. As such patient prescribed Keppra and advised to follow-up with primary care provider  Clinical Course    ____________________________________________  FINAL CLINICAL IMPRESSION(S) / ED DIAGNOSES  Final diagnoses:  Seizure disorder (HCC)     MEDICATIONS GIVEN DURING THIS VISIT:  Medications  levETIRAcetam (KEPPRA) 1,000 mg in sodium chloride 0.9 % 100 mL IVPB (not administered)     NEW OUTPATIENT MEDICATIONS STARTED DURING THIS VISIT:  New Prescriptions   No medications on file      Note:  This document was prepared using Dragon voice recognition software and may include unintentional dictation errors.    Darci Currentandolph N Brown,  MD 12/06/15 [phone]

## 2015-12-04 ENCOUNTER — Encounter: Payer: Self-pay | Admitting: Neurology

## 2015-12-04 ENCOUNTER — Encounter: Payer: Self-pay | Admitting: *Deleted

## 2015-12-04 ENCOUNTER — Ambulatory Visit (INDEPENDENT_AMBULATORY_CARE_PROVIDER_SITE_OTHER): Payer: Managed Care, Other (non HMO) | Admitting: Neurology

## 2015-12-04 ENCOUNTER — Telehealth: Payer: Self-pay | Admitting: Neurology

## 2015-12-04 ENCOUNTER — Telehealth: Payer: Self-pay | Admitting: Emergency Medicine

## 2015-12-04 VITALS — BP 127/86 | HR 72 | Ht 75.0 in | Wt 136.5 lb

## 2015-12-04 DIAGNOSIS — G40209 Localization-related (focal) (partial) symptomatic epilepsy and epileptic syndromes with complex partial seizures, not intractable, without status epilepticus: Secondary | ICD-10-CM | POA: Diagnosis not present

## 2015-12-04 MED ORDER — LEVETIRACETAM 500 MG PO TABS: ORAL_TABLET | ORAL | 6 refills | Status: AC

## 2015-12-04 NOTE — Telephone Encounter (Signed)
Left message for pt to return my call.

## 2015-12-04 NOTE — Telephone Encounter (Signed)
Faxed failed - called Tina in HR - correct fax #[phone] - sent and confirmed.

## 2015-12-04 NOTE — Progress Notes (Signed)
PATIENT: Peter [name] DOB: 07/11/1985  Chief Complaint  Patient presents with  . Seizures    Reports being diagnosed with seizures as a child.  He had been on Depakote 1000mg , BID until two months ago.  He stopped the medication because he felt too sedated when taking it.  States he had his worst seizure, on 12/03/15, while at work.  He was treated at the ED and placed on Keppra 500mg , BID.  He started the medication this morning.  Prior to this event, it had been 2+ years since his last seizure.     HISTORICAL  Peter [name] is a 30 years old right-handed male, seen in refer by emergency room for evaluation of seizure.  He reported a history of seizure since sixth grade at age 30, his seizure usually preceded by spacing out, which can lasting for few hours, before he went into generalized seizure, he was treated with generic Depakote without effectively control his seizure, he was switched to brand name Depakote ER 500 mg 2 tablets twice a day later, [facility], last visit was around age 30. He could not recall the detailed report of MRI, and EEG,  Later prescription was filled by his primary care physician, he has seizure every 2-3 years despite taking Depakote ER 500 mg 2 tablets twice a day, he also complains of feeling drowsy while taking the medications.  Around June 2017, he decided to taper him off Depakote, he had recurrent seizure on December 03 2015 at his work, is preceded by few hours feeling confused, spacing out, then generalized seizure at work, lasting for 10 minutes, with prolonged post ictal confusion.  He was taken by ambulance to emergency room, I personally reviewed the record, laboratory evaluation showed no significant abnormality on CBC, CMP,  He did report while he was off Depakote, he was feeling better, is now treated with Keppra 500 mg twice a day, no significant side effect noticed.  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 He works on third shift from 8:30 PM to 5 AM  Before this seizure on December 03 2015, last seizure was in 2014, prior to that was in 2011  He denies a family history of epilepsy.  REVIEW OF SYSTEMS: Full 14 system review of systems performed and notable only for dizziness, seizure, weakness, headaches, migraines  ALLERGIES: Allergies  Allergen Reactions  . Amoxicillin Rash and Other (See Comments)    Swelling of the throat    HOME MEDICATIONS: Current Outpatient Prescriptions  Medication Sig Dispense Refill  . ibuprofen (ADVIL,MOTRIN) 200 MG tablet Take 200 mg by mouth as needed.    . levETIRAcetam (KEPPRA) 500 MG tablet Take 1 tablet (500 mg total) by mouth 2 (two) times daily. 60 tablet 0   No current facility-administered medications for this visit.     PAST MEDICAL HISTORY: Past Medical History:  Diagnosis Date  . Asthma   . Migraine   . Seizures (HCC)     PAST SURGICAL HISTORY: Past Surgical History:  Procedure Laterality Date  . FOOT SURGERY Right Age 89    FAMILY HISTORY: Family History  Problem Relation Age of Onset  . Healthy Mother   . Healthy Father   . Cancer Maternal Grandmother     Bone Marrow  . Mental illness Maternal Grandfather   . Mental illness Paternal Grandmother   . Mental illness Paternal Grandfather  SOCIAL HISTORY:  Social History   Social History  . Marital status: Single    Spouse name: N/A  . Number of children: 1  . Years of education: HS   Occupational History  . Asst supervisor/Line worker    Social History Main Topics  . Smoking status: Current Every Day Smoker    Packs/day: 1.00    Types: Cigarettes  . Smokeless tobacco: Never Used  . Alcohol use No  . Drug use: No  . Sexual activity: Not on file   Other Topics Concern  . Not on file   Social History Narrative   Lives at home with his son and his mother.   Right-handed.   Ten or more 20oz  caffeinated sodas per day.     PHYSICAL EXAM   Vitals:   12/04/15 1126  BP: 127/86  Pulse: 72  Weight: 136 lb 8 oz (61.9 kg)  Height: 6\' 3"  (1.905 m)    Not recorded      Body mass index is 17.06 kg/m.  PHYSICAL EXAMNIATION:  Gen: NAD, conversant, well nourised, obese, well groomed                     Cardiovascular: Regular rate rhythm, no peripheral edema, warm, nontender. Eyes: Conjunctivae clear without exudates or hemorrhage Neck: Supple, no carotid bruise. Pulmonary: Clear to auscultation bilaterally   NEUROLOGICAL EXAM:  MENTAL STATUS: Speech:    Speech is normal; fluent and spontaneous with normal comprehension.  Cognition:     Orientation to time, place and person     Normal recent and remote memory     Normal Attention span and concentration     Normal Language, naming, repeating,spontaneous speech     Fund of knowledge   CRANIAL NERVES: CN II: Visual fields are full to confrontation. Fundoscopic exam is normal with sharp discs and no vascular changes. Pupils are round equal and briskly reactive to light. CN III, IV, VI: extraocular movement are normal. No ptosis. CN V: Facial sensation is intact to pinprick in all 3 divisions bilaterally. Corneal responses are intact.  CN VII: Face is symmetric with normal eye closure and smile. CN VIII: Hearing is normal to rubbing fingers CN IX, X: Palate elevates symmetrically. Phonation is normal. CN XI: Head turning and shoulder shrug are intact CN XII: He has right lateral tongue biting Mark  MOTOR: There is no pronator drift of out-stretched arms. Muscle bulk and tone are normal. Muscle strength is normal.  REFLEXES: Reflexes are 2+ and symmetric at the biceps, triceps, knees, and ankles. Plantar responses are flexor.  SENSORY: Intact to light touch, pinprick, positional sensation and vibratory sensation are intact in fingers and toes.  COORDINATION: Rapid alternating movements and fine finger movements  are intact. There is no dysmetria on finger-to-nose and heel-knee-shin.    GAIT/STANCE: Posture is normal. Gait is steady with normal steps, base, arm swing, and turning. Heel and toe walking are normal. Tandem gait is normal.  Romberg is absent.   DIAGNOSTIC DATA (LABS, IMAGING, TESTING) - I reviewed patient records, labs, notes, testing and imaging myself where available.   ASSESSMENT AND PLAN  Peter [name] is a 30 y.o. male    COMPLEX PARTIAL SEIZURE WITH SECONDARY GENERALIZATION  complete evaluation with MRI of the brain with and without contrast  EEG  Increase Keppra to 500 mg 1 in the morning, 2 tablets at night  No driving until seizure free for 6 months  Return to clinic  for follow-up in one month   Levert Feinstein, M.D. Ph.D.  [facility] [address], [address] [place], [place] [address] Ph: [phone] Fax: [phone]  CC: Referring Provider

## 2015-12-04 NOTE — Telephone Encounter (Signed)
Spoke to patient - return to work date added to letter and faxed to his HR department.

## 2015-12-04 NOTE — Telephone Encounter (Signed)
Pt called in after having office visit today and noticed that the letter Dr. Terrace ArabiaYan gave him will need an actual return to work date on the letter.May fax [phone], Phone: [phone]: Inetta Fermoina [facility]( HR department ) May call pt at [phone]

## 2015-12-04 NOTE — Telephone Encounter (Signed)
Patient called to ask about work note.  Says his face was still painful when he tried to go to work last night.  I told him we would leave one at the stat desk for him.  I noticed that he has a pediatrician as his pcp and follow up doctor.  He says that was a long time ago.  He does not have pcp now.  He does not have a neurologist for many years. (had one at [facility])  I explained that there is a neurologist in [place], but that he really needs to follow up with [facility] within next few days, as [facility] was on call.  I told him to inquire about becoming [facility], with any referrals per his insurance guidelines.  He agrees to go for recheck.

## 2015-12-31 ENCOUNTER — Other Ambulatory Visit: Payer: Managed Care, Other (non HMO)

## 2016-01-01 ENCOUNTER — Encounter: Payer: Self-pay | Admitting: Neurology

## 2016-01-22 ENCOUNTER — Ambulatory Visit: Payer: Managed Care, Other (non HMO) | Admitting: Neurology

## 2016-01-23 ENCOUNTER — Telehealth: Payer: Self-pay | Admitting: *Deleted

## 2016-01-23 ENCOUNTER — Ambulatory Visit: Payer: Managed Care, Other (non HMO) | Admitting: Neurology

## 2016-01-23 NOTE — Telephone Encounter (Signed)
No showed follow up appointment. 

## 2016-01-24 ENCOUNTER — Encounter: Payer: Self-pay | Admitting: Neurology

## 2016-06-19 ENCOUNTER — Encounter (HOSPITAL_COMMUNITY): Payer: Self-pay | Admitting: Emergency Medicine

## 2016-06-19 ENCOUNTER — Ambulatory Visit (HOSPITAL_COMMUNITY)
Admission: EM | Admit: 2016-06-19 | Discharge: 2016-06-19 | Disposition: A | Discharge status: Home / Self Care | Payer: Commercial Managed Care - PPO | Attending: Internal Medicine | Admitting: Internal Medicine

## 2016-06-19 DIAGNOSIS — R300 Dysuria: Secondary | ICD-10-CM | POA: Insufficient documentation

## 2016-06-19 DIAGNOSIS — Z79899 Other long term (current) drug therapy: Secondary | ICD-10-CM | POA: Diagnosis not present

## 2016-06-19 DIAGNOSIS — F17210 Nicotine dependence, cigarettes, uncomplicated: Secondary | ICD-10-CM | POA: Diagnosis not present

## 2016-06-19 DIAGNOSIS — N410 Acute prostatitis: Secondary | ICD-10-CM | POA: Diagnosis not present

## 2016-06-19 DIAGNOSIS — B354 Tinea corporis: Secondary | ICD-10-CM | POA: Diagnosis not present

## 2016-06-19 DIAGNOSIS — R21 Rash and other nonspecific skin eruption: Secondary | ICD-10-CM | POA: Insufficient documentation

## 2016-06-19 LAB — POCT URINALYSIS DIP (DEVICE)
Bilirubin Urine: NEGATIVE
Glucose, UA: NEGATIVE mg/dL
HGB URINE DIPSTICK: NEGATIVE
KETONES UR: NEGATIVE mg/dL
Leukocytes, UA: NEGATIVE
Nitrite: NEGATIVE
PROTEIN: NEGATIVE mg/dL
SPECIFIC GRAVITY, URINE: 1.01 (ref 1.005–1.030)
UROBILINOGEN UA: 0.2 mg/dL (ref 0.0–1.0)
pH: 7 (ref 5.0–8.0)

## 2016-06-19 MED ORDER — CLOTRIMAZOLE-BETAMETHASONE 1-0.05 % EX CREA: 1.0000 "application " | TOPICAL_CREAM | Freq: Two times a day (BID) | CUTANEOUS | 0 refills | Status: AC

## 2016-06-19 MED ORDER — TAMSULOSIN HCL 0.4 MG PO CAPS: 0.4000 mg | ORAL_CAPSULE | Freq: Every day | ORAL | 0 refills | Status: AC

## 2016-06-19 MED ORDER — CIPROFLOXACIN HCL 500 MG PO TABS: 500.0000 mg | ORAL_TABLET | Freq: Two times a day (BID) | ORAL | 0 refills | Status: AC

## 2016-06-19 NOTE — ED Triage Notes (Signed)
See s/s.  In addition to rash on foot, burning with urination.  Denies discharge.  Burning with urination has been intermittent for 2 months

## 2016-06-19 NOTE — ED Provider Notes (Signed)
CSN: [id number]     Arrival date & time 06/19/16  1026 History   None    Chief Complaint  Patient presents with  . Rash   (Consider location/radiation/quality/duration/timing/severity/associated sxs/prior Treatment) 31 year old male patient presents to clinic with a 2 month history of dysuria, along with pain when he climaxes. He denies any penile discharge, denies flank pain, abdominal pain, or fever. He states he has had occasional pelvic pressure, and increased urinary frequency.  Patient also presents with a one-month history of a rash on the dorsal surface of his foot. States the rash is not painful, however it is itchy. He reports he has tried over-the-counter lotions and creams with minimal relief.   The history is provided by the patient.  Rash    Past Medical History:  Diagnosis Date  . Asthma   . Migraine   . Seizures (HCC)    Past Surgical History:  Procedure Laterality Date  . FOOT SURGERY Right Age 28   Family History  Problem Relation Age of Onset  . Healthy Mother   . Healthy Father   . Cancer Maternal Grandmother     Bone Marrow  . Mental illness Maternal Grandfather   . Mental illness Paternal Grandmother   . Mental illness Paternal Grandfather    Social History  Substance Use Topics  . Smoking status: Current Every Day Smoker    Packs/day: 1.00    Types: Cigarettes  . Smokeless tobacco: Never Used  . Alcohol use No    Review of Systems  Reason unable to perform ROS: as covered in HPI.  Skin: Positive for rash.  All other systems reviewed and are negative.   Allergies  Amoxicillin  Home Medications   Prior to Admission medications   Medication Sig Start Date End Date Taking? Authorizing Provider  levETIRAcetam (KEPPRA) 500 MG tablet One tab po qam and 2 tabs po qhs 12/04/15  Yes Levert FeinsteinYijun Yan, MD  ciprofloxacin (CIPRO) 500 MG tablet Take 1 tablet (500 mg total) by mouth 2 (two) times daily. 06/19/16 07/03/16  Dorena BodoLawrence Sonu Kruckenberg, NP   clotrimazole-betamethasone (LOTRISONE) cream Apply 1 application topically 2 (two) times daily. 06/19/16   Dorena BodoLawrence Avnoor Koury, NP  ibuprofen (ADVIL,MOTRIN) 200 MG tablet Take 200 mg by mouth as needed.    Historical Provider, MD  tamsulosin (FLOMAX) 0.4 MG CAPS capsule Take 1 capsule (0.4 mg total) by mouth daily. 06/19/16   Dorena BodoLawrence Davia Smyre, NP   Meds Ordered and Administered this Visit  Medications - No data to display  BP 155/61 (BP Location: Right Arm)   Pulse 71   Temp 98.4 F (36.9 C) (Oral)   Resp 18   SpO2 98%  No data found.   Physical Exam  Constitutional: He is oriented to person, place, and time. He appears well-developed and well-nourished. No distress.  HENT:  Head: Normocephalic and atraumatic.  Right Ear: External ear normal.  Left Ear: External ear normal.  Mouth/Throat: Oropharynx is clear and moist.  Cardiovascular: Normal rate and regular rhythm.   Pulmonary/Chest: Effort normal and breath sounds normal.  Abdominal: Soft. Bowel sounds are normal.  Genitourinary: Testes normal and penis normal. Rectal exam shows no external hemorrhoid, no internal hemorrhoid, no tenderness and guaiac negative stool. Prostate is enlarged and tender. Cremasteric reflex is present. Right testis shows no mass, no swelling and no tenderness. Left testis shows no mass, no swelling and no tenderness. Circumcised. No penile tenderness. No discharge found.  Neurological: He is alert and oriented to person, place, and  time.  Skin: Skin is warm and dry. Capillary refill takes less than 2 seconds. He is not diaphoretic. There is erythema.  2 cm in diameter, erythemic, scaling, lesion dorsal surface of the right foot.  Psychiatric: He has a normal mood and affect.  Nursing note and vitals reviewed.   Urgent Care Course     Procedures (including critical care time)  Labs Review Labs Reviewed  POCT URINALYSIS DIP (DEVICE)  URINE CYTOLOGY ANCILLARY ONLY    Imaging Review No results  found.      MDM   1. Tinea corporis   2. Acute prostatitis    Urine cytology collected to test for gonorrhea and chlamydia.  I am treating your today for prostatitis. I have prescribed Cipro, take 1 tablet twice a day for 14 days. I have also prescribed a medicine called Flomax, take one tablet daily. Drink plenty of fluids, reduce the amount of caffeine you drink daily. Should your symptoms persist, follow up with your primary care provider or return to clinic.  With regard to your rash, I am treating you for a fungal infection. I have prescribed a combination antifungal/steroid to help with your symptoms. Apply twice a day for 2 weeks. Should your symptoms persist return to clinic or follow up with dermatology.     Dorena Bodo, NP 06/19/16 1141

## 2016-06-19 NOTE — Discharge Instructions (Signed)
I am treating your today for prostatitis. I have prescribed Cipro, take 1 tablet twice a day for 14 days. I have also prescribed a medicine called Flomax, take one tablet daily. Drink plenty of fluids, reduce the amount of caffeine you drink daily. Should your symptoms persist, follow up with your primary care provider or return to clinic.  With regard to your rash, I am treating you for a fungal infection. I have prescribed a combination antifungal/steroid to help with your symptoms. Apply twice a day for 2 weeks. Should your symptoms persist return to clinic or follow up with dermatology.

## 2016-06-20 ENCOUNTER — Telehealth: Payer: Self-pay | Admitting: Internal Medicine

## 2016-06-20 LAB — URINE CYTOLOGY ANCILLARY ONLY
CHLAMYDIA, DNA PROBE: POSITIVE — AB
Neisseria Gonorrhea: NEGATIVE

## 2016-06-20 MED ORDER — AZITHROMYCIN 500 MG PO TABS: 1000.0000 mg | ORAL_TABLET | Freq: Once | ORAL | 0 refills | Status: AC

## 2016-06-20 NOTE — Telephone Encounter (Signed)
Clinical staff, please let patient and health department know that test for chlamydia was positive.  Rx zithromax sent to pharmacy of record, [facility] on [address] at [address] [address].  Sexual partners need to be notified and tested/treated.  Condoms may reduce risk of reinfection.  Recheck for further evaluation if symptoms are not improving.  LM

## 2016-07-17 ENCOUNTER — Ambulatory Visit (HOSPITAL_COMMUNITY)
Admission: EM | Admit: 2016-07-17 | Discharge: 2016-07-17 | Disposition: A | Discharge status: Home / Self Care | Payer: Commercial Managed Care - PPO | Attending: Internal Medicine | Admitting: Internal Medicine

## 2016-07-17 ENCOUNTER — Encounter (HOSPITAL_COMMUNITY): Payer: Self-pay | Admitting: Family Medicine

## 2016-07-17 DIAGNOSIS — G44219 Episodic tension-type headache, not intractable: Secondary | ICD-10-CM | POA: Diagnosis not present

## 2016-07-17 DIAGNOSIS — Z8669 Personal history of other diseases of the nervous system and sense organs: Secondary | ICD-10-CM

## 2016-07-17 DIAGNOSIS — Z87898 Personal history of other specified conditions: Secondary | ICD-10-CM

## 2016-07-17 MED ORDER — KETOROLAC TROMETHAMINE 60 MG/2ML IM SOLN
45.0000 mg | Freq: Once | INTRAMUSCULAR | Status: AC
  Administered 2016-07-17: 45 mg via INTRAMUSCULAR

## 2016-07-17 MED ORDER — DEXAMETHASONE SODIUM PHOSPHATE 10 MG/ML IJ SOLN
INTRAMUSCULAR | Status: AC
  Filled 2016-07-17: qty 1

## 2016-07-17 MED ORDER — KETOROLAC TROMETHAMINE 30 MG/ML IJ SOLN: 45.0000 mg | Freq: Once | INTRAMUSCULAR | Status: DC

## 2016-07-17 MED ORDER — KETOROLAC TROMETHAMINE 60 MG/2ML IM SOLN
INTRAMUSCULAR | Status: AC
  Filled 2016-07-17: qty 2

## 2016-07-17 MED ORDER — DEXAMETHASONE SODIUM PHOSPHATE 10 MG/ML IJ SOLN
10.0000 mg | Freq: Once | INTRAMUSCULAR | Status: AC
  Administered 2016-07-17: 10 mg via INTRAMUSCULAR

## 2016-07-17 NOTE — ED Provider Notes (Signed)
CSN: [id number]     Arrival date & time 07/17/16  1405 History   First MD Initiated Contact with Patient 07/17/16 1457     Chief Complaint  Patient presents with  . Migraine   (Consider location/radiation/quality/duration/timing/severity/associated sxs/prior Treatment) 31 year old male states he has a history of migraines associated with seizures since he was in the sixth grade. He states that he had a seizure 2 days ago. He is currently treated with Keppra. States has been compliant with medications. The pain is located in the occipital area with air is local tenderness. Denies photophobia, nausea or vomiting, problems with vision, speech, hearing, swallowing. He has not seen his neurologist since August 2017 when he had a seizure and his next appointment is in May and cannot get one any sooner. He has no PCP.       Past Medical History:  Diagnosis Date  . Asthma   . Migraine   . Seizures (HCC)    Past Surgical History:  Procedure Laterality Date  . FOOT SURGERY Right Age 51   Family History  Problem Relation Age of Onset  . Healthy Mother   . Healthy Father   . Cancer Maternal Grandmother     Bone Marrow  . Mental illness Maternal Grandfather   . Mental illness Paternal Grandmother   . Mental illness Paternal Grandfather    Social History  Substance Use Topics  . Smoking status: Current Every Day Smoker    Packs/day: 1.00    Types: Cigarettes  . Smokeless tobacco: Never Used  . Alcohol use No    Review of Systems  Constitutional: Negative.   HENT: Negative.   Eyes: Negative.  Negative for photophobia, pain and visual disturbance.  Respiratory: Negative.   Cardiovascular: Negative for chest pain.  Gastrointestinal: Negative.   Genitourinary: Negative.   Musculoskeletal: Negative.   Skin: Negative.   Neurological: Positive for dizziness, seizures and headaches. Negative for tremors, syncope, facial asymmetry, speech difficulty and numbness.  All other systems  reviewed and are negative.   Allergies  Amoxicillin  Home Medications   Prior to Admission medications   Medication Sig Start Date End Date Taking? Authorizing Provider  clotrimazole-betamethasone (LOTRISONE) cream Apply 1 application topically 2 (two) times daily. 06/19/16   Dorena Bodo, NP  ibuprofen (ADVIL,MOTRIN) 200 MG tablet Take 200 mg by mouth as needed.    Historical Provider, MD  levETIRAcetam (KEPPRA) 500 MG tablet One tab po qam and 2 tabs po qhs 12/04/15   Levert Feinstein, MD  tamsulosin (FLOMAX) 0.4 MG CAPS capsule Take 1 capsule (0.4 mg total) by mouth daily. 06/19/16   Dorena Bodo, NP   Meds Ordered and Administered this Visit   Medications  ketorolac (TORADOL) 30 MG/ML injection 45 mg (not administered)  dexamethasone (DECADRON) injection 10 mg (not administered)    BP 129/84   Pulse 73   Temp 98.5 F (36.9 C)   Resp 18   SpO2 99%  No data found.   Physical Exam  Constitutional: He is oriented to person, place, and time. He appears well-developed and well-nourished. No distress.  HENT:  Head: Normocephalic and atraumatic.  Mouth/Throat: Oropharynx is clear and moist.  Soft palate rises symmetrically. Tongue and uvula midline. Normal swallowing reflex.  Eyes: Conjunctivae and EOM are normal. Pupils are equal, round, and reactive to light.  Neck: Normal range of motion. Neck supple.  Patient points to the posterior neck and scalp as the source of his migraine headache. Palpation reveals reproducible tenderness to  the splenius capitis, trapezius muscles along the posterior neck that insert to the posterior scalp. He states this tenderness is causing his headache in which she presents. Full range of motion of the neck. Flexion causes pain in the back of the neck as he stretches the posterior muscles.  Cardiovascular: Normal rate, regular rhythm and normal heart sounds.   Pulmonary/Chest: Effort normal and breath sounds normal. No respiratory distress.   Musculoskeletal: Normal range of motion. He exhibits no edema.  Lymphadenopathy:    He has no cervical adenopathy.  Neurological: He is alert and oriented to person, place, and time. He has normal strength. He displays no tremor. No cranial nerve deficit or sensory deficit. Coordination and gait normal. GCS eye subscore is 4. GCS verbal subscore is 5. GCS motor subscore is 6.  Skin: Skin is warm and dry. He is not diaphoretic.  Psychiatric: Judgment normal.  Nursing note and vitals reviewed.   Urgent Care Course     Procedures (including critical care time)  Labs Review Labs Reviewed - No data to display  Imaging Review No results found.   Visual Acuity Review  Right Eye Distance:   Left Eye Distance:   Bilateral Distance:    Right Eye Near:   Left Eye Near:    Bilateral Near:         MDM   1. Episodic tension-type headache, not intractable   2. History of seizures    They particular headache you are having today appears to be more muscular tension type headache than migraine. After you have been given medication today he may take ibuprofen every 6-8 hours starting about 6 hours if needed at home. Strongly suggest that  obtain a primary care provider as soon as possible. See your neurologist as soon as possible. Continue to take your seizure medication. He will need to have a seizure workup. You need to have lab work and other evaluation to make sure that the medication is sufficient to control your seizures and the blood levels are normal. Driving with a seizure disorder that is not controlled is dangerous and can be deadly. The same for walking around dangerous machinery. Meds ordered this encounter  Medications  . ketorolac (TORADOL) 30 MG/ML injection 45 mg  . dexamethasone (DECADRON) injection 10 mg       Hayden Rasmussenavid Clyde Zarrella, NP 07/17/16 1527

## 2016-07-17 NOTE — Discharge Instructions (Signed)
They particular headache you are having today appears to be more muscular tension type headache than migraine. After you have been given medication today he may take ibuprofen every 6-8 hours starting about 6 hours if needed at home. Strongly suggest that  obtain a primary care provider as soon as possible. See your neurologist as soon as possible. Continue to take your seizure medication. He will need to have a seizure workup. You need to have lab work and other evaluation to make sure that the medication is sufficient to control your seizures and the blood levels are normal. Driving with a seizure disorder that is not controlled is dangerous and can be deadly. The same for walking around dangerous machinery.

## 2016-07-17 NOTE — ED Triage Notes (Signed)
Pt here for migraine and dizziness. sts that he had a seizure Tuesday. sts he takes Keppra for his seizures and hasn't missed any meds. sts migraines cause his seizures. sts he cant get into his Neurologist until may,

## 2016-09-09 ENCOUNTER — Ambulatory Visit: Payer: Managed Care, Other (non HMO) | Admitting: Neurology

## 2017-08-31 ENCOUNTER — Emergency Department (HOSPITAL_COMMUNITY): Payer: Managed Care, Other (non HMO)

## 2017-08-31 ENCOUNTER — Other Ambulatory Visit: Payer: Self-pay

## 2017-08-31 ENCOUNTER — Encounter (HOSPITAL_COMMUNITY): Payer: Self-pay

## 2017-08-31 ENCOUNTER — Emergency Department (HOSPITAL_COMMUNITY)
Admission: EM | Admit: 2017-08-31 | Discharge: 2017-08-31 | Disposition: A | Discharge status: Home / Self Care | Payer: Managed Care, Other (non HMO) | Attending: Emergency Medicine | Admitting: Emergency Medicine

## 2017-08-31 DIAGNOSIS — M549 Dorsalgia, unspecified: Secondary | ICD-10-CM | POA: Diagnosis present

## 2017-08-31 DIAGNOSIS — M546 Pain in thoracic spine: Secondary | ICD-10-CM | POA: Diagnosis not present

## 2017-08-31 DIAGNOSIS — M5441 Lumbago with sciatica, right side: Secondary | ICD-10-CM

## 2017-08-31 DIAGNOSIS — F17210 Nicotine dependence, cigarettes, uncomplicated: Secondary | ICD-10-CM | POA: Insufficient documentation

## 2017-08-31 DIAGNOSIS — J45909 Unspecified asthma, uncomplicated: Secondary | ICD-10-CM | POA: Diagnosis not present

## 2017-08-31 DIAGNOSIS — Z79899 Other long term (current) drug therapy: Secondary | ICD-10-CM | POA: Diagnosis not present

## 2017-08-31 DIAGNOSIS — M545 Low back pain: Secondary | ICD-10-CM | POA: Diagnosis not present

## 2017-08-31 MED ORDER — DEXAMETHASONE SODIUM PHOSPHATE 10 MG/ML IJ SOLN
10.0000 mg | Freq: Once | INTRAMUSCULAR | Status: AC
  Administered 2017-08-31: 10 mg via INTRAMUSCULAR
  Filled 2017-08-31: qty 1

## 2017-08-31 MED ORDER — KETOROLAC TROMETHAMINE 30 MG/ML IJ SOLN
30.0000 mg | Freq: Once | INTRAMUSCULAR | Status: AC
  Administered 2017-08-31: 30 mg via INTRAVENOUS
  Filled 2017-08-31: qty 1

## 2017-08-31 MED ORDER — METHYLPREDNISOLONE 4 MG PO TBPK: ORAL_TABLET | ORAL | 0 refills | Status: AC

## 2017-08-31 NOTE — Discharge Instructions (Signed)
Your x-rays do not show any acute fractures or abnormalities.  Please take course of steroids as directed in addition to ibuprofen and Tylenol, you can also use topical pain reliever such as Biofreeze or salonpas patches.  He will need to follow-up with orthopedics, if pain continues you may need an MRI.  Return to the emergency department if you experience weakness or numbness in both legs, are unable to walk, lose control of your bowels or bladder, develop fever or any other new or concerning symptoms.

## 2017-08-31 NOTE — ED Notes (Addendum)
Pt stated that his care team treated him good. Pt stated that the care team listen to him and listen to what brought him in today.

## 2017-08-31 NOTE — ED Triage Notes (Signed)
Pt states he picked something up about a week and half ago and felt a pop stating that the pain is worsening and in the middle of his back.

## 2017-08-31 NOTE — ED Provider Notes (Signed)
[name] Provider Note   CSN: [id number] Arrival date & time: 08/31/17  1324     History   Chief Complaint Chief Complaint  Patient presents with  . Back Pain    HPI [name] is a 32 y.o. male.  [name] is a 32 y.o. Male with a history of asthma, migraines and seizures, who presents to the emergency [name] for evaluation of back pain.  Patient reports he was bending down to pick up a very heavy rotor at work about a week and ago and felt a pop in his mid back, he reports initially he was not experiencing any pain but since then he has had worsening pain that starts in the middle of his back and radiates down through his right leg.  Pain is made worse with movement, especially forward bending or lifting.  Patient is continue to try to work but has become increasingly more painful.  He reports his right leg occasionally feels a little bit weak, he denies any numbness, he denies any symptoms at all in the left leg.  No loss of bowel or bladder control, no abdominal pain or urinary symptoms.  No fevers, no history of IV drug use or cancer.  Patient has been treating his symptoms with ibuprofen with minimal improvement but pain continues to progress.  He has been ambulatory without difficulty at home and here in the [name] today.       Past Medical History:  Diagnosis Date  . Asthma   . Migraine   . Seizures (HCC)     There are no active problems to display for this patient.   Past Surgical History:  Procedure Laterality Date  . FOOT SURGERY Right Age 50        Home Medications    Prior to Admission medications   Medication Sig Start Date End Date Taking? Authorizing Provider  clotrimazole-betamethasone (LOTRISONE) cream Apply 1 application topically 2 (two) times daily. 06/19/16   Dorena Bodo, NP  ibuprofen (ADVIL,MOTRIN) 200 MG tablet Take 200 mg by mouth as needed.    [provider]    levETIRAcetam (KEPPRA) 500 MG tablet One tab po qam and 2 tabs po qhs 12/04/15   Levert Feinstein, MD  tamsulosin (FLOMAX) 0.4 MG CAPS capsule Take 1 capsule (0.4 mg total) by mouth daily. 06/19/16   Dorena Bodo, NP    Family History Family History  Problem Relation Age of Onset  . Healthy Mother   . Healthy Father   . Cancer Maternal Grandmother        Bone Marrow  . Mental illness Maternal Grandfather   . Mental illness Paternal Grandmother   . Mental illness Paternal Grandfather     Social History Social History   Tobacco Use  . Smoking status: Current Every Day Smoker    Packs/day: 1.00    Types: Cigarettes  . Smokeless tobacco: Never Used  Substance Use Topics  . Alcohol use: No    Alcohol/week: 0.0 oz  . Drug use: No     Allergies   Amoxicillin   Review of Systems Review of Systems  Constitutional: Negative for chills and fever.  Gastrointestinal: Negative for abdominal pain, constipation and nausea.  Genitourinary: Negative for difficulty urinating, dysuria, flank pain and frequency.  Musculoskeletal: Positive for back pain. Negative for arthralgias, gait problem, joint swelling, myalgias, neck pain and neck stiffness.  Skin: Negative for color change and rash.  Neurological: Positive for weakness. Negative  for dizziness, light-headedness, numbness and headaches.     Physical Exam Updated Vital Signs BP (!) 139/98   Pulse 75   Temp 98.2 F (36.8 C)   Resp 16   Ht  (1.88 m)   Wt 72.6 kg (160 lb)   SpO2 100%   BMI 20.54 kg/m   Physical Exam  Constitutional: He appears well-developed and well-nourished. No distress.  HENT:  Head: Normocephalic and atraumatic.  Eyes: Right eye exhibits no discharge. Left eye exhibits no discharge.  Neck:  C-spine without midline or paraspinal tenderness  Pulmonary/Chest: Effort normal and breath sounds normal. No stridor. No respiratory distress. He has no wheezes. He has no rales.  Abdominal: Soft. Bowel  sounds are normal. He exhibits no distension and no mass. There is no tenderness. There is no guarding.  Abdomen soft, nondistended, nontender to palpation in all quadrants without guarding or peritoneal signs, no CVA tenderness  Musculoskeletal:  Midline tenderness of the lower thoracic spine without overlying erythema, warmth or appreciable skin changes, no palpable deformity, patient does have mild scoliosis, tenderness extends down the right side and radiates into the leg, repeatedly reproducible with straight leg raise.  No symptoms noted in the left leg.  Patient does not have any midline lumbar tenderness.  Neurological: He is alert. Coordination normal.  Speech is clear, able to follow commands CN III-XII intact Normal strength in upper and lower extremities bilaterally including dorsiflexion and plantar flexion, strong and equal grip strength 2+ DTRs in bilateral lower extremities Sensation normal to light and sharp touch Moves extremities without ataxia, coordination intact   Skin: Skin is warm and dry. Capillary refill takes less than 2 seconds. He is not diaphoretic.  Psychiatric: He has a normal mood and affect. His behavior is normal.  Nursing note and vitals reviewed.    ED Treatments / Results  Labs (all labs ordered are listed, but only abnormal results are displayed) Labs Reviewed - No data to display  EKG None  Radiology No results found.  Procedures Procedures (including critical care time)  Medications Ordered in ED Medications - No data to display   Initial Impression / Assessment and Plan / ED Course  I have reviewed the triage vital signs and the nursing notes.  Pertinent labs & imaging results that were available during my care of the patient were reviewed by me and considered in my medical decision making (see chart for details).  Normal neurological exam, no evidence of urinary incontinence or retention, pain is consistently reproducible. There is  no evidence of AAA or concern for dissection at this time.   Patient can walk but states is painful.  No loss of bowel or bladder control.  No concern for cauda equina.  No fever, night sweats, weight loss, h/o cancer, IVDU. XRs of the thoracic and lumbar spine show scoliosis, no acute bony abnormalities. Pain treated here in the [name] with adequate improvement. RICE protocol and pain medicine indicated and discussed with patient. Pt to follow up with PCP and ortho. I have also discussed reasons to return immediately to the ER.  Patient expresses understanding and agrees with plan.    Final Clinical Impressions(s) / ED Diagnoses   Final diagnoses:  Acute midline low back pain with right-sided sciatica  Acute midline thoracic back pain    ED Discharge Orders        Ordered    methylPREDNISolone (MEDROL DOSEPAK) 4 MG TBPK tablet     08/31/17 1217  Legrand Rams 08/31/17 2028    Gerhard Munch, MD 09/02/17 [phone]

## 2017-10-28 ENCOUNTER — Encounter: Payer: Self-pay | Admitting: Neurology

## 2017-10-28 ENCOUNTER — Telehealth: Payer: Self-pay | Admitting: *Deleted

## 2017-10-28 ENCOUNTER — Encounter

## 2017-10-28 ENCOUNTER — Institutional Professional Consult (permissible substitution): Payer: Managed Care, Other (non HMO) | Admitting: Neurology

## 2017-10-28 NOTE — Telephone Encounter (Signed)
No showed consult appointment. 

## 2019-03-01 IMAGING — DX DG THORACIC SPINE 2V
4 series · 4 of 4 positions shown · non-contrast
Comparison: 09/24/2007 CT of the chest

CLINICAL DATA: Back pain following heavy lifting 1 week ago,
initial encounter

EXAM:
THORACIC SPINE 2 VIEWS

[t thoracic spine ap]
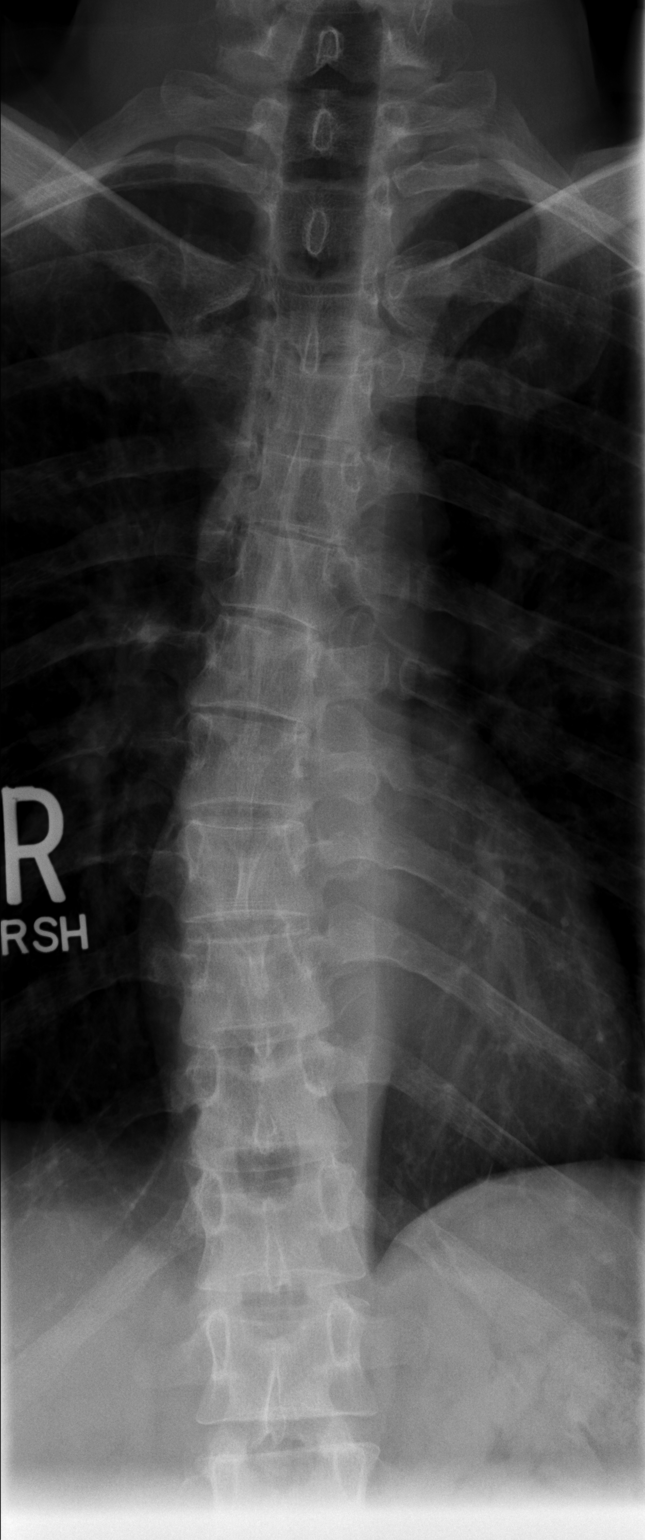

[t thoracic breathing lat (1 of 2)]
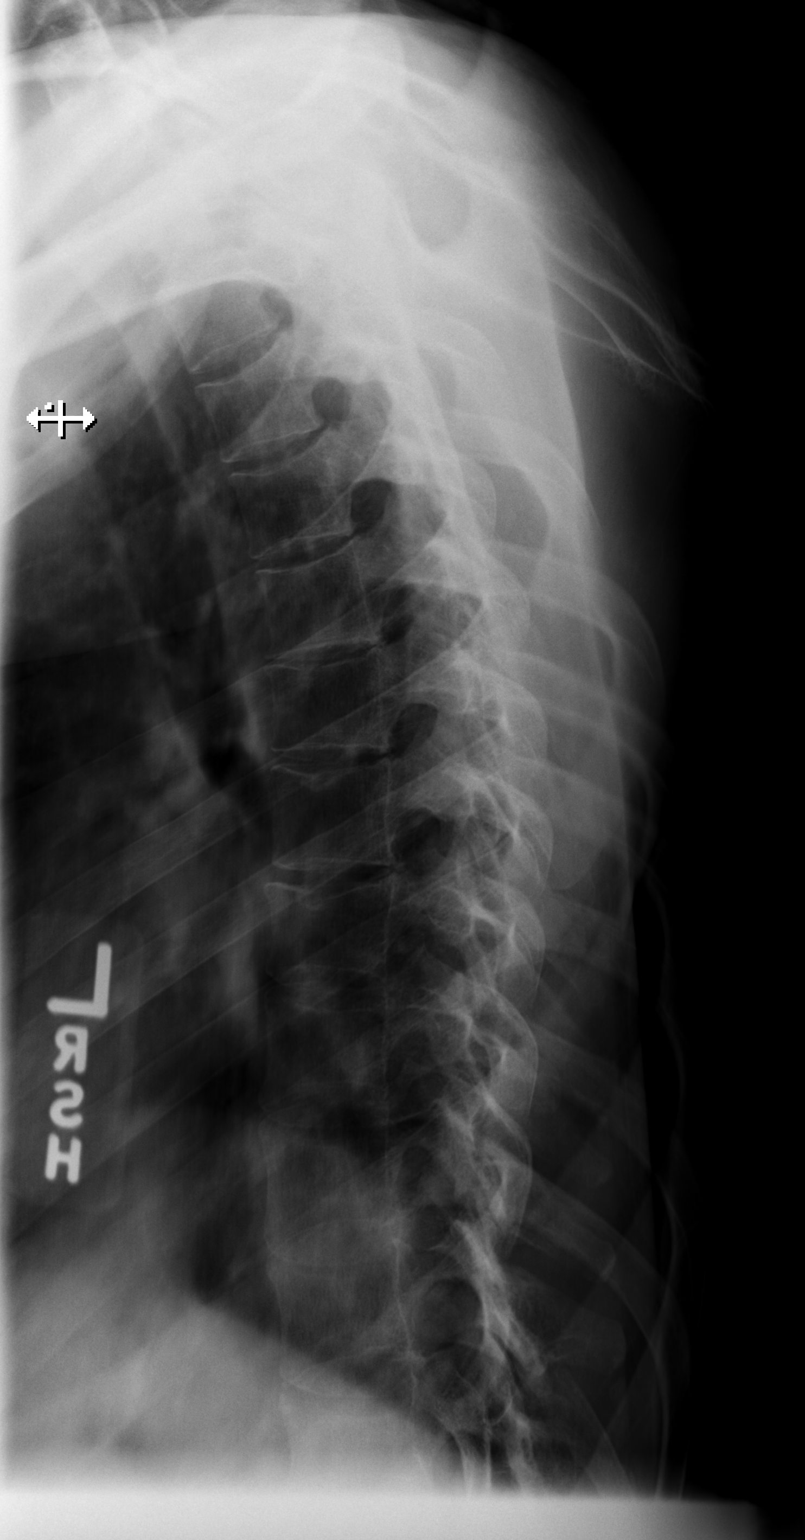

[t thoracic breathing lat (2 of 2)]
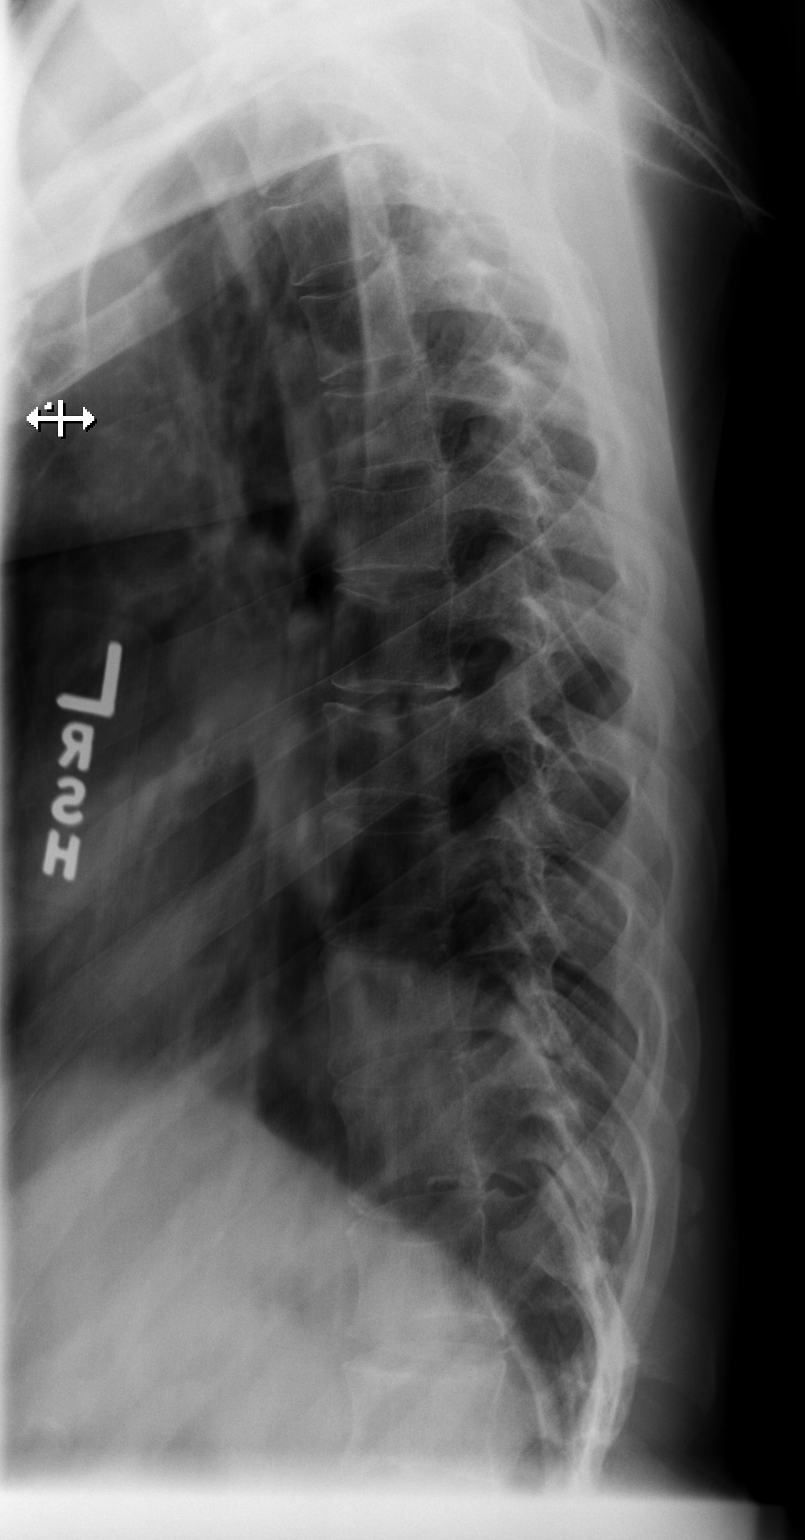

[t thoracic swimmers]
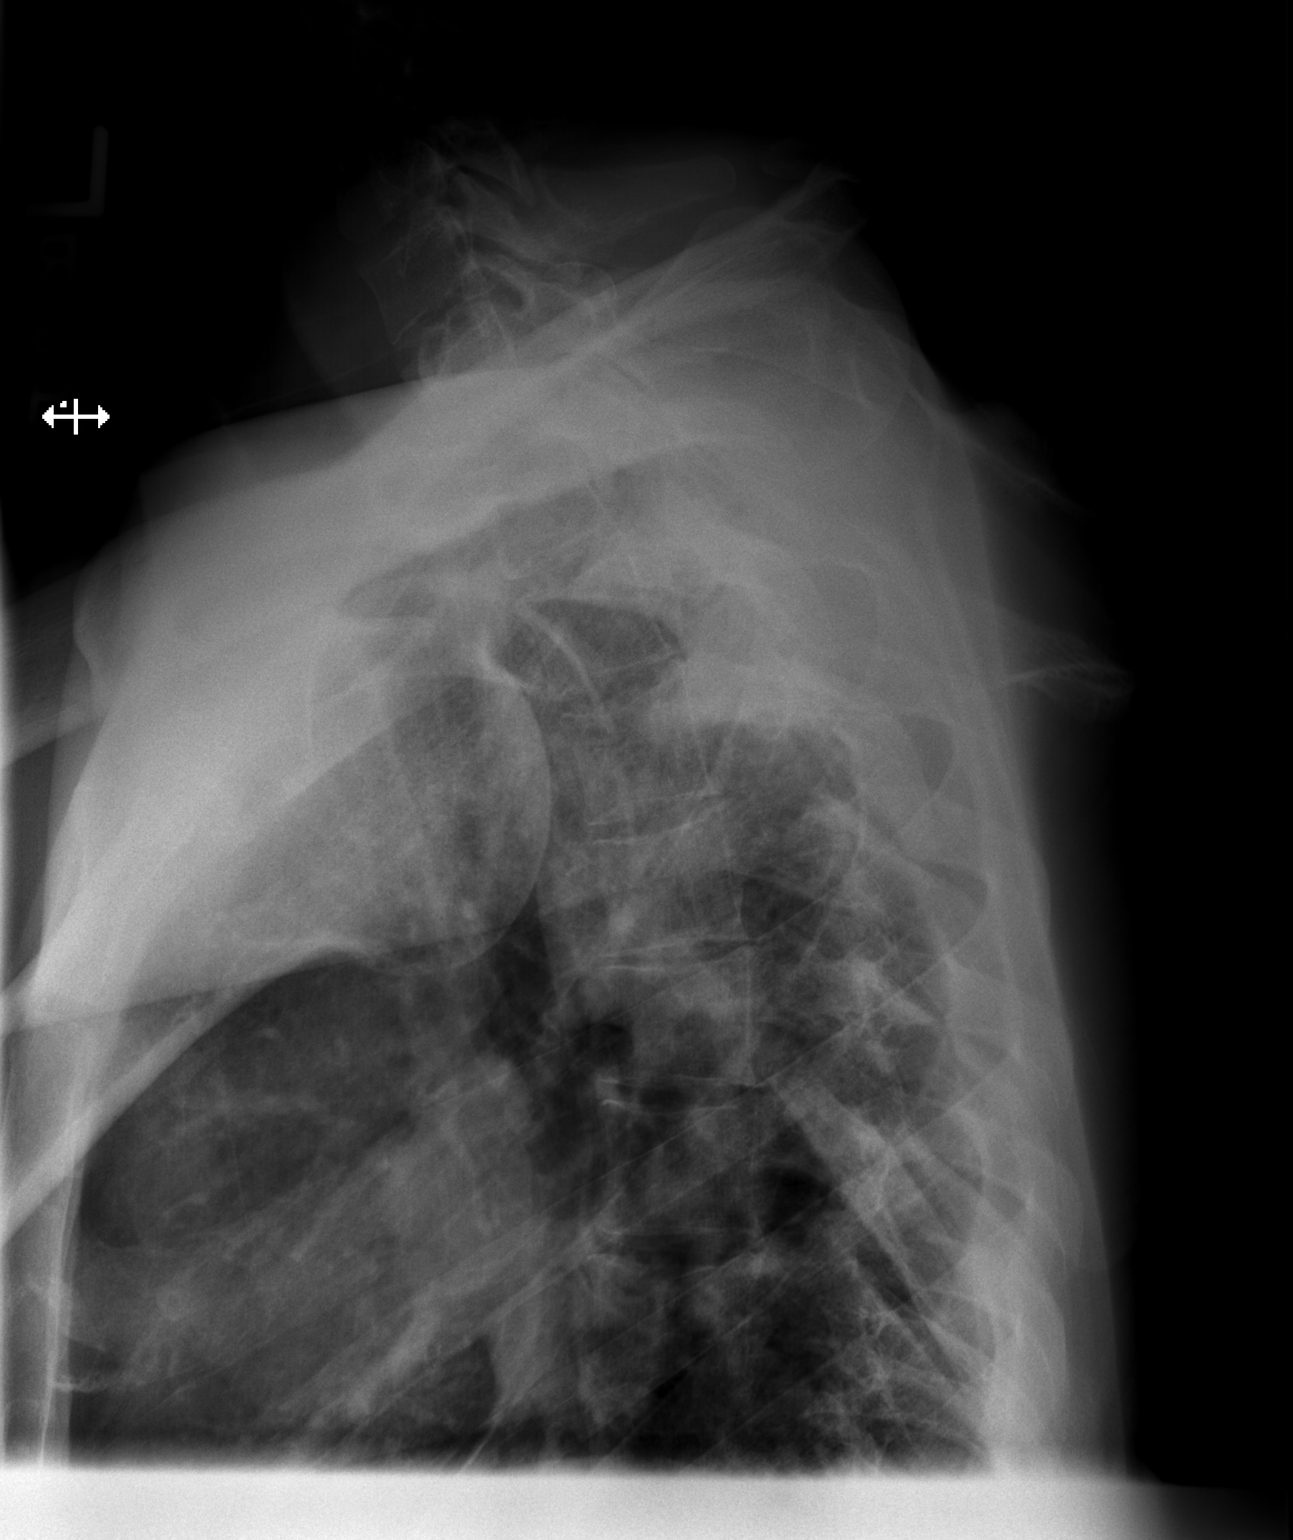

[4 of 4 positions shown; findings below may reference images not displayed]

FINDINGS: Mild scoliosis of the thoracic spine concave to the left is noted.
No definitive compression deformity is seen. Mild Schmorl's nodes
are noted. No paraspinal mass or pedicle abnormality is seen.
IMPRESSION: Mild scoliosis without acute abnormality.
